# Patient Record
Sex: Female | Born: 2005 | Race: Black or African American | Hispanic: No | Marital: Single | Smoking: Never smoker
Health system: Southern US, Community
[De-identification: ages and names within clinical notes are randomized; demographics above are authoritative.]

## PROBLEM LIST (undated history)

## (undated) DIAGNOSIS — R51 Headache: Secondary | ICD-10-CM

## (undated) DIAGNOSIS — R519 Headache, unspecified: Secondary | ICD-10-CM

## (undated) HISTORY — PX: WISDOM TOOTH EXTRACTION: SHX21

## (undated) HISTORY — DX: Headache, unspecified: R51.9

## (undated) HISTORY — PX: TONSILLECTOMY AND ADENOIDECTOMY: SUR1326

## (undated) HISTORY — DX: Headache: R51

## (undated) HISTORY — PX: HERNIA REPAIR: SHX51

---
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
## 2023-01-29 IMAGING — US US BREAST*R* LIMITED INC AXILLA
2 series · 9 of 9 positions shown · non-contrast
Comparison: Previous exam(s).

CLINICAL DATA: 14-year-old female presenting for six-month
follow-up of a probably benign right breast mass.

EXAM:
ULTRASOUND OF THE RIGHT BREAST

[Series 1: us breast*right* limited inc axilla · 0.04mm/px · 6 of 6 slices shown (1 of 2)]
[im 1/6]
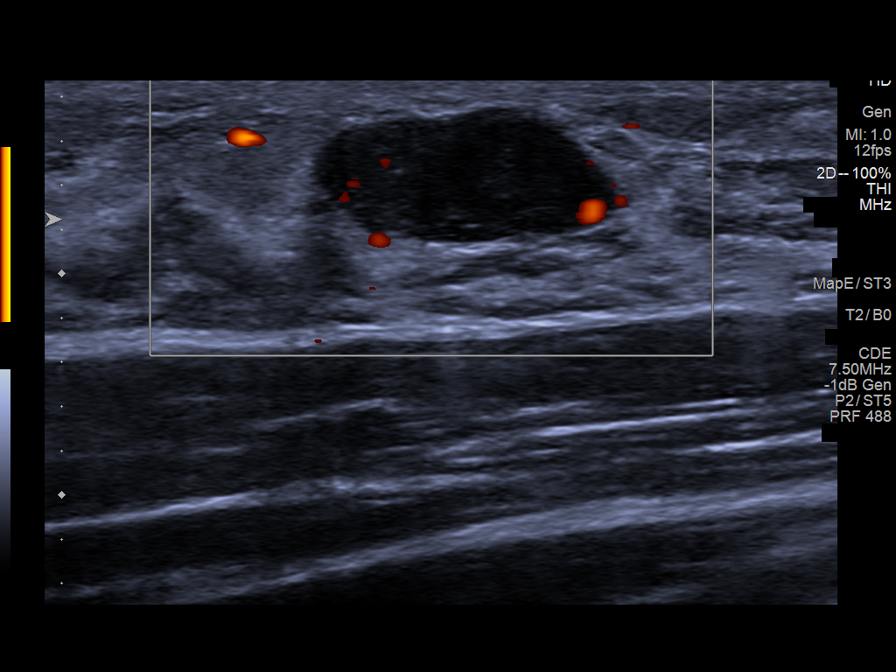
[im 2/6]
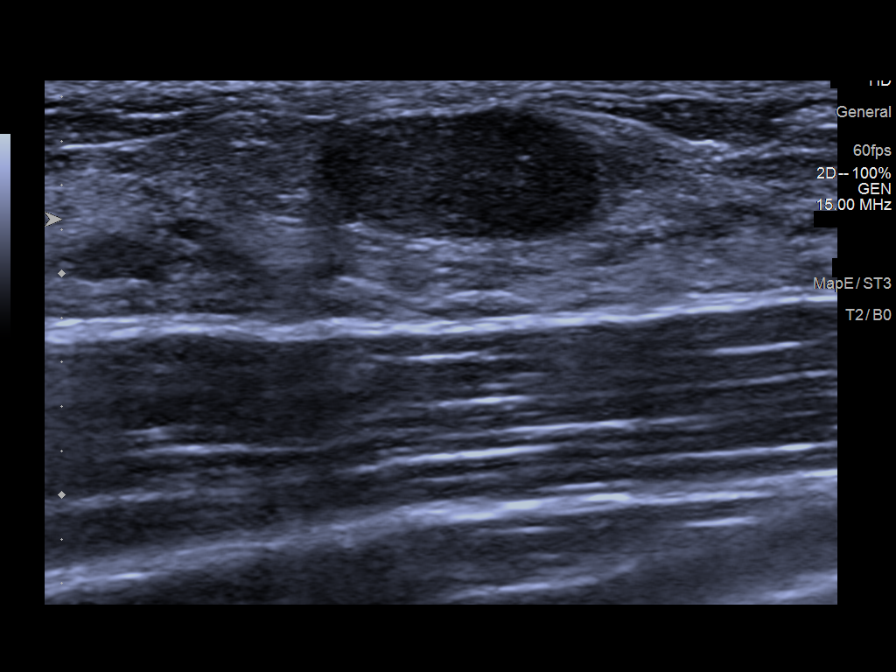
[im 3/6]
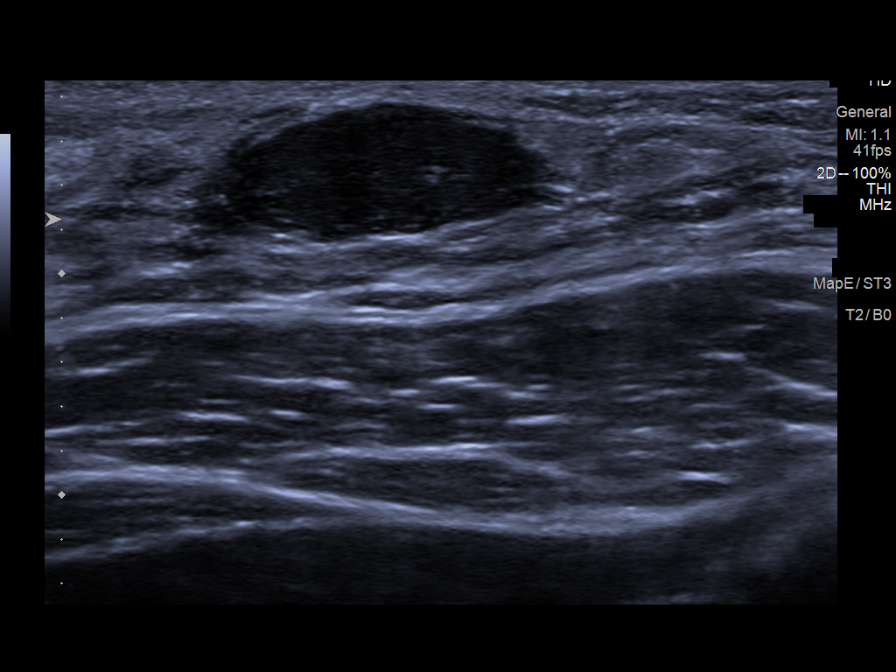
[im 4/6]
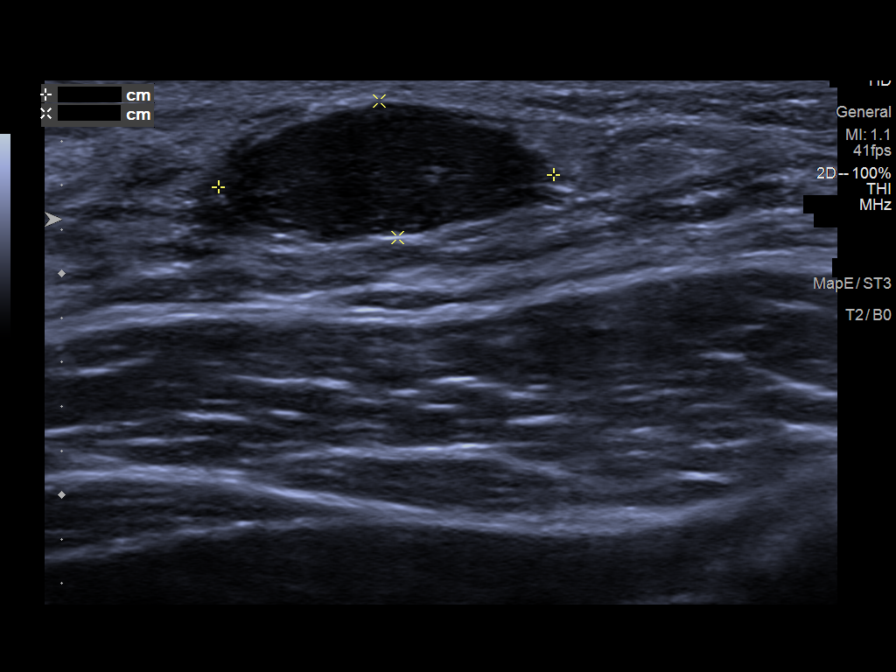
[im 5/6]
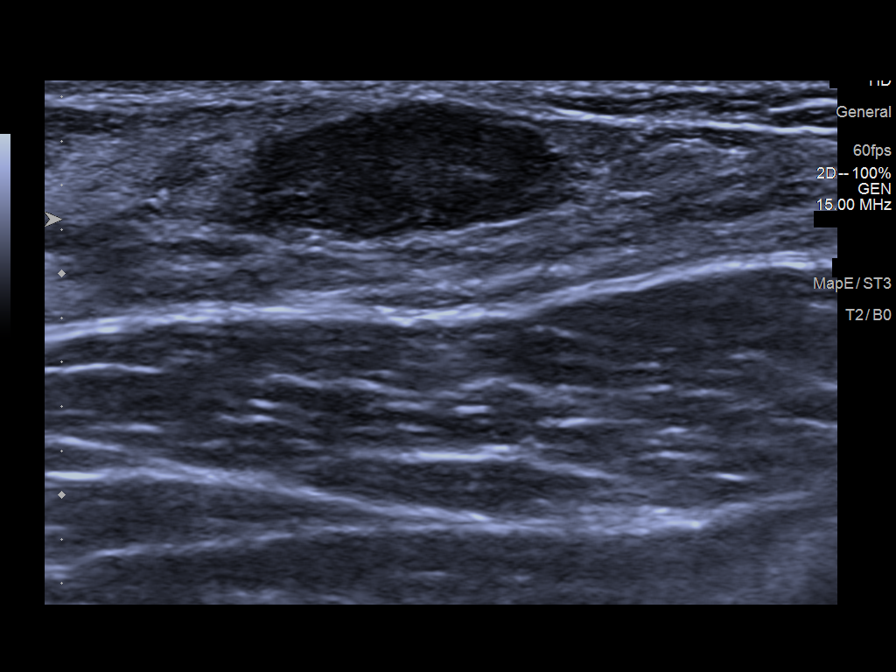
[im 6/6]
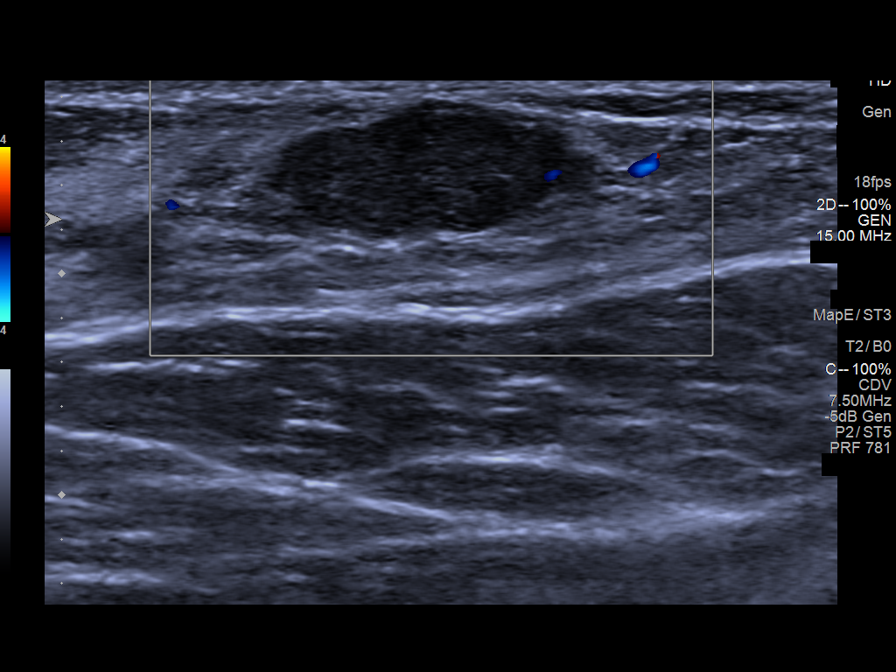

[Series 2: us breast*right* limited inc axilla · 0.05mm/px · 3 of 3 slices shown (2 of 2)]
[im 1/3]
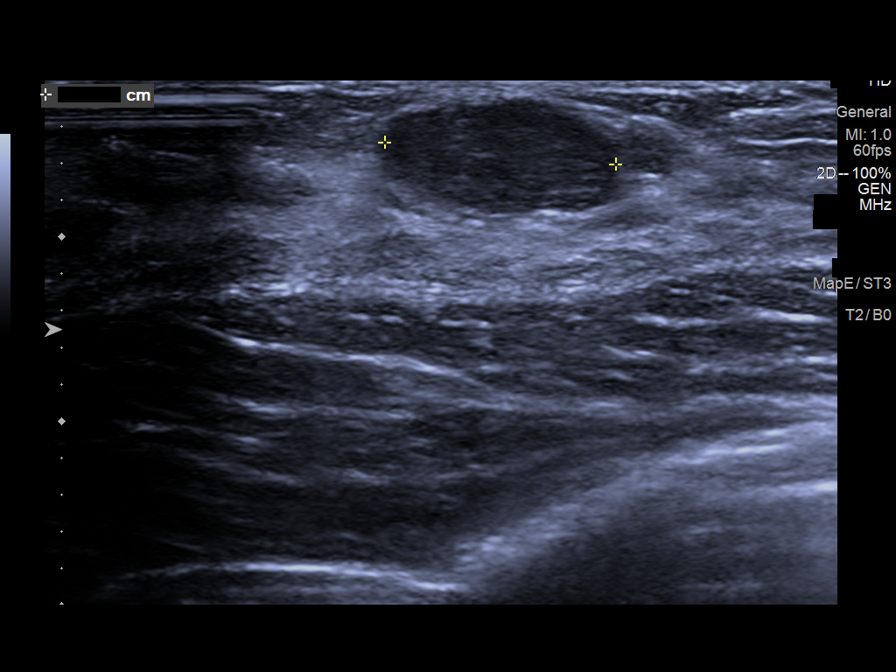
[im 2/3]
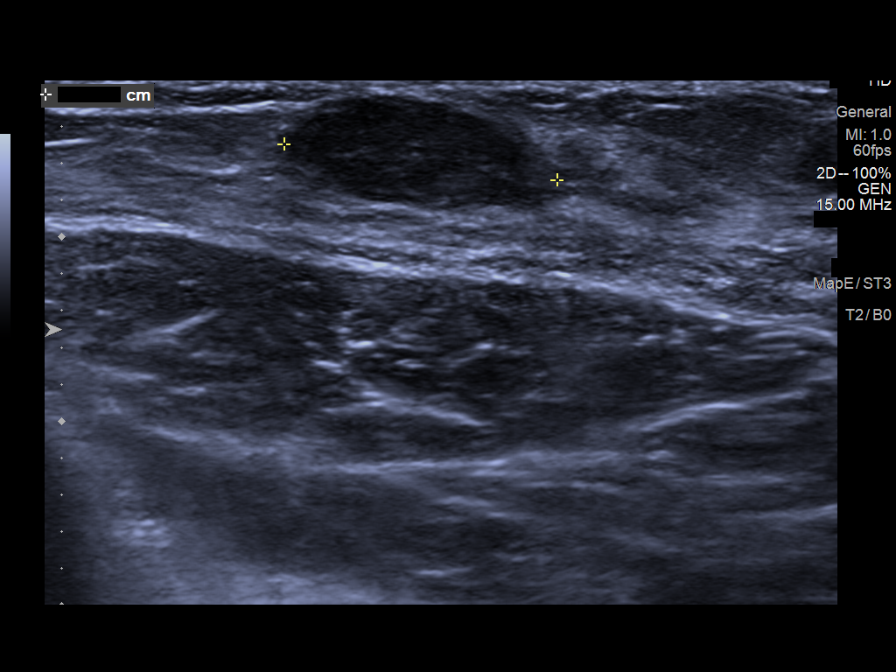
[im 3/3]
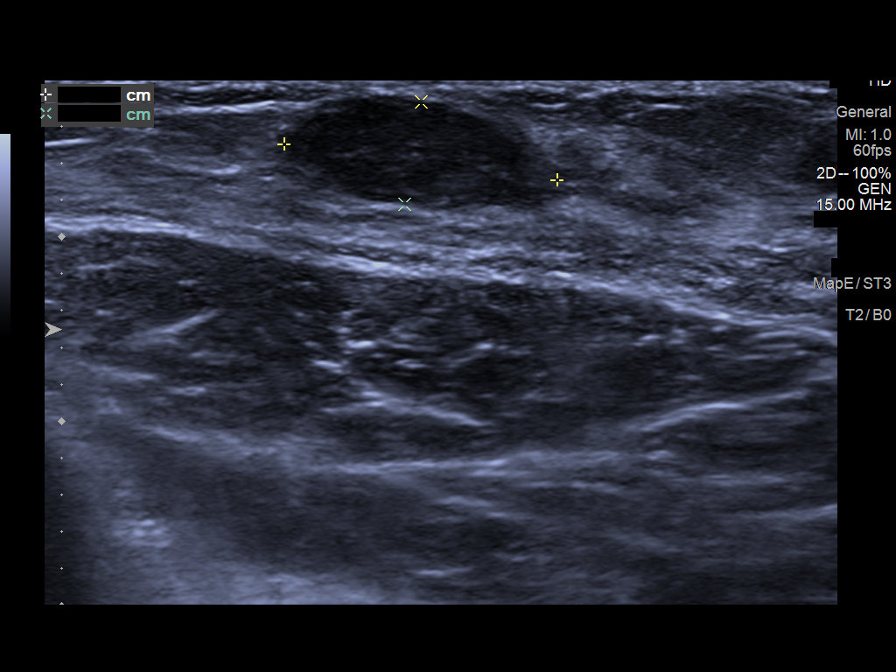

[9 of 9 positions shown; findings below may reference images not displayed]

FINDINGS: Targeted ultrasound is performed in the right breast at 1 o'clock 3
cm from the nipple demonstrating an oval circumscribed hypoechoic
mass measuring 1.5 x 0.6 x 1.3 cm, previously measuring 1.3 x 0.6 x
1.1 cm.
IMPRESSION: Stable probably benign mass in the right breast at 1 o'clock, likely
a fibroadenoma.

RECOMMENDATION:
Right breast ultrasound in 6 months.

I have discussed the findings and recommendations with the patient
and her mother. If applicable, a reminder letter will be sent to the
patient regarding the next appointment.

BI-RADS CATEGORY  3: Probably benign.

## 2023-02-09 ENCOUNTER — Ambulatory Visit (HOSPITAL_COMMUNITY)
Admission: RE | Admit: 2023-02-09 | Discharge: 2023-02-09 | Disposition: A | Discharge status: Home / Self Care | Payer: Medicaid Other | Source: Ambulatory Visit | Attending: Pediatrics | Admitting: Pediatrics

## 2023-02-09 ENCOUNTER — Other Ambulatory Visit: Payer: Self-pay | Admitting: Pediatrics

## 2023-02-09 ENCOUNTER — Encounter: Payer: Self-pay | Admitting: Pediatrics

## 2023-02-09 DIAGNOSIS — R109 Unspecified abdominal pain: Secondary | ICD-10-CM | POA: Diagnosis present

## 2023-03-07 ENCOUNTER — Telehealth (INDEPENDENT_AMBULATORY_CARE_PROVIDER_SITE_OTHER): Payer: Self-pay | Admitting: Family

## 2023-03-07 NOTE — Telephone Encounter (Signed)
  Name of who is calling: [facility]   Caller's Relationship to Patient: pcp  Best contact number: [phone]  Provider they see: Inetta Fermo   Reason for call: PCP is calling wondering if Inetta Fermo will still see pt due to her being 17 now and her last appt being last year in 2023. She states if not, could Inetta Fermo refer her to adult neurology. She says pt has been having some trouble with movements and swelling for several weeks. She would like a call back regarding this.       PRESCRIPTION REFILL ONLY  Name of prescription:  Pharmacy:

## 2023-03-07 NOTE — Telephone Encounter (Signed)
Please advise 

## 2023-03-08 NOTE — Telephone Encounter (Signed)
I left a message for the caller saying that since this is a new problem that she needs a referral to adult neurology. TG

## 2023-03-26 ENCOUNTER — Encounter: Payer: Self-pay | Admitting: Neurology

## 2023-03-26 ENCOUNTER — Ambulatory Visit (INDEPENDENT_AMBULATORY_CARE_PROVIDER_SITE_OTHER): Payer: Medicaid Other | Admitting: Neurology

## 2023-03-26 VITALS — BP 122/75 | HR 66 | Ht 62.0 in | Wt 132.5 lb

## 2023-03-26 DIAGNOSIS — G43709 Chronic migraine without aura, not intractable, without status migrainosus: Secondary | ICD-10-CM

## 2023-03-26 DIAGNOSIS — R1900 Intra-abdominal and pelvic swelling, mass and lump, unspecified site: Secondary | ICD-10-CM | POA: Diagnosis not present

## 2023-03-26 MED ORDER — RIZATRIPTAN BENZOATE 5 MG PO TBDP: 5.0000 mg | ORAL_TABLET | ORAL | 6 refills | Status: AC | PRN

## 2023-03-26 MED ORDER — ONDANSETRON HCL 4 MG PO TABS: 4.0000 mg | ORAL_TABLET | Freq: Three times a day (TID) | ORAL | 6 refills | Status: AC | PRN

## 2023-03-26 NOTE — Progress Notes (Signed)
Chief Complaint  Patient presents with   New Patient (Initial Visit)    Rm45mother present ,  pain to palpation/movement and visible right flank swelling(ongoing since mid July), ? focal myositis.       ASSESSMENT AND PLAN  Natalie Mclaughlin is a 17 y.o. female   Right flank area soft tissue mass  CT abdomen pelvic with contrast Chronic migraine headache  She was taking Topamax but only as needed, advised her to take Topamax every day as migraine prevention, Maxalt Zofran as needed   DIAGNOSTIC DATA (LABS, IMAGING, TESTING) - I reviewed patient records, labs, notes, testing and imaging myself where available.   MEDICAL HISTORY:  Natalie Mclaughlin, is a 17 year old female seen in request by her primary care physician Dr. Bronson Ing, for evaluation of right flank pain, initial evaluation was March 26, 2023  I reviewed and summarized the referring note.PMHx. Asthma Chronic migraine  She is currently on freshman at college, majoring sports medicine, practice cheerleader 2 hours each day, in summer she become more sedentary, in July 2024, she noticed fullness at the right flank area, mild tenderness with deep palpitation, initially noted slow enlargement of the soft tissue underneath her skin, but recent few weeks, the size has not changed, it does not affecting her daily activity, including practice cheerleader, but if she lie on it for extended period of time, she would feel pressure pain, discomfort, has to switch sides  Ultrasound of the area on February 09, 2023 reported normal sonographic appearance of subcutaneous fat and underlying musculature, no discrete lesion is present  She also have a history of chronic migraine headaches, at least once a week, she contributed to her irregular eating pattern, typical migraine are retro-orbital area moderate to severe pain, light noise sensitivity, nauseous, lasting for few hours, she tried over-the-counter medications with limited  help  Laboratory evaluation in July 2024, CPK was 323, ferritin was 6, C-reactive protein less than 1, ESR of 13, normal CMP, creatinine of 1.02, anemia hemoglobin of 10,  PHYSICAL EXAM:   Vitals:   03/26/23 0952  BP: 122/75  Pulse: 66  Weight: 132 lb 8 oz (60.1 kg)  Height: 5\' 2"  (1.575 m)   Not recorded     Body mass index is 24.23 kg/m.  PHYSICAL EXAMNIATION:  Gen: NAD, conversant, well nourised, well groomed                     Cardiovascular: Regular rate rhythm, no peripheral edema, warm, nontender. Eyes: Conjunctivae clear without exudates or hemorrhage Neck: Supple, no carotid bruits. Pulmonary: Clear to auscultation bilaterally   NEUROLOGICAL EXAM:  MENTAL STATUS: Speech/cognition: Awake, alert, oriented to history taking and casual conversation CRANIAL NERVES: CN II: Visual fields are full to confrontation. Pupils are round equal and briskly reactive to light. CN III, IV, VI: extraocular movement are normal. No ptosis. CN V: Facial sensation is intact to light touch CN VII: Face is symmetric with normal eye closure  CN VIII: Hearing is normal to causal conversation. CN IX, X: Phonation is normal. CN XI: Head turning and shoulder shrug are intact  MOTOR: There is no pronator drift of out-stretched arms. Muscle bulk and tone are normal. Muscle strength is normal.  There is a right flank area soft tissue mass, no clear boundary, palm size, mild tenderness with deep palpitation  REFLEXES: Reflexes are 2+ and symmetric at the biceps, triceps, knees, and ankles. Plantar responses are flexor.  SENSORY: Intact to light touch, pinprick  and vibratory sensation are intact in fingers and toes.  COORDINATION: There is no trunk or limb dysmetria noted.  GAIT/STANCE: Posture is normal. Gait is steady with normal steps, base, arm swing, and turning. Heel and toe walking are normal. Tandem gait is normal.  Romberg is absent.  REVIEW OF SYSTEMS:  Full 14 system  review of systems performed and notable only for as above All other review of systems were negative.   ALLERGIES: Allergies  Allergen Reactions   Apple Anaphylaxis   Apple Juice Anaphylaxis   Cephalexin Swelling   Other Anaphylaxis    Seafood, Trees, Pet dander   Peanut Oil Anaphylaxis   Peanut-Containing Drug Products Anaphylaxis   Shellfish Allergy Swelling    HOME MEDICATIONS: Current Outpatient Medications  Medication Sig Dispense Refill   Aspirin-Acetaminophen-Caffeine (EXCEDRIN PO) Give 1 tablet at onset of migraine     cetirizine (ZYRTEC) 10 MG tablet Take 10 mg by mouth daily.  5   EPINEPHrine 0.3 mg/0.3 mL IJ SOAJ injection See admin instructions.  0   fluticasone (FLONASE) 50 MCG/ACT nasal spray Place 2 sprays into both nostrils daily.     hydrocortisone 2.5 % ointment APPLY TOPICALLY 2 (TWO) TIMES DAILY. APPLY TWICE DAILY TO AREAS OF ECZEMA ON FACE  11   montelukast (SINGULAIR) 10 MG tablet Take 10 mg by mouth daily.  5   norgestimate-ethinyl estradiol (ORTHO-CYCLEN) 0.25-35 MG-MCG tablet Take 1 tablet by mouth daily.     promethazine (PHENERGAN) 12.5 MG suppository Place 12.5 mg rectally every 6 (six) hours as needed for nausea or vomiting.     PROVENTIL HFA 108 (90 BASE) MCG/ACT inhaler Inhale 108 mcg into the lungs every 6 (six) hours as needed.  0   No current facility-administered medications for this visit.    PAST MEDICAL HISTORY: Past Medical History:  Diagnosis Date   Headache     PAST SURGICAL HISTORY: Past Surgical History:  Procedure Laterality Date   HERNIA REPAIR     17 Yeras old and 2 repairs at 17 years old   TONSILLECTOMY AND ADENOIDECTOMY     17 Years old    FAMILY HISTORY: Family History  Problem Relation Age of Onset   Migraines Mother    Stroke Mother    Migraines Father    Migraines Maternal Grandfather     SOCIAL HISTORY: Social History   Socioeconomic History   Marital status: Single    Spouse name: Not on file   Number of  children: 0   Years of education: Not on file   Highest education level: Some college, no degree  Occupational History   Not on file  Tobacco Use   Smoking status: Never   Smokeless tobacco: Never  Vaping Use   Vaping status: Never Used  Substance and Sexual Activity   Alcohol use: No    Alcohol/week: 0.0 standard drinks of alcohol   Drug use: No   Sexual activity: Yes    Birth control/protection: Pill, Condom  Other Topics Concern   Not on file  Social History Narrative   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   Social Determinants of Health   Financial Resource Strain: Not on file  Food Insecurity: Not on file  Transportation Needs: Not on file  Physical Activity: Not on file  Stress: Not on file  Social Connections: Not on file  Intimate Partner Violence: Not on file      Levert Feinstein,  M.D. Ph.D.  [facility] [address] [place], [place] [address] Ph: [phone] Fax: [phone]  CC:  Bronson Ing, MD [address] [place],  [place] [address]  Chrys Racer, MD

## 2023-04-09 ENCOUNTER — Encounter: Payer: Self-pay | Admitting: Neurology

## 2023-04-17 ENCOUNTER — Ambulatory Visit
Admission: RE | Admit: 2023-04-17 | Discharge: 2023-04-17 | Disposition: A | Discharge status: Home / Self Care | Payer: Medicaid Other | Source: Ambulatory Visit | Attending: Neurology | Admitting: Neurology

## 2023-04-17 ENCOUNTER — Other Ambulatory Visit: Payer: Self-pay | Admitting: Neurology

## 2023-04-17 DIAGNOSIS — R1900 Intra-abdominal and pelvic swelling, mass and lump, unspecified site: Secondary | ICD-10-CM

## 2023-04-17 DIAGNOSIS — G43709 Chronic migraine without aura, not intractable, without status migrainosus: Secondary | ICD-10-CM

## 2023-04-17 MED ORDER — IOPAMIDOL (ISOVUE-300) INJECTION 61%
500.0000 mL | Freq: Once | INTRAVENOUS | Status: AC | PRN
  Administered 2023-04-17: 80 mL via INTRAVENOUS

## 2023-04-23 ENCOUNTER — Telehealth: Payer: Self-pay | Admitting: Neurology

## 2023-04-23 ENCOUNTER — Telehealth: Payer: Self-pay

## 2023-04-23 DIAGNOSIS — G4489 Other headache syndrome: Secondary | ICD-10-CM

## 2023-04-23 NOTE — Telephone Encounter (Signed)
Pt called and LVM stating that her migraines are reoccurring and worsening. Please call back to discuss.

## 2023-04-23 NOTE — Telephone Encounter (Signed)
Mychart message sent.

## 2023-04-23 NOTE — Telephone Encounter (Signed)
Call back to patient, she reports that she has not been taking the Topamax as daily prevention as advised by Dr. Terrace Arabia and is in agreement to start and use Maxalt and Zofran as needed. Encouraged patient to call with any changes or concerns. Patient verbalized understanding.

## 2023-04-23 NOTE — Telephone Encounter (Signed)
Pt reports that she went to her PCP today who suggested she reach out to her Neurologist when she explained that for the last 3 days she has had a headache, she has felt nauseated , tingling in arm, when driving yesterday she felt numbness on the side of her body.  Pt is asking for a call to discuss this with Dr Terrace Arabia

## 2023-04-23 NOTE — Telephone Encounter (Signed)
Returned call to patient, she states that yesterday she had a migraine with tingling and numbness on right side of body and that has happened before with her migraines. She reports nausea as well yesterday. She reports taking a maxalt yesterday and some Excedrin. I reviewed dosing and administration of maxalt and patient was appreciative because she wasn't aware that could have repeat dose of maxalt but no more than 2 in 1 day.. Patient states she just has headache today and it's much better but after seeing PCP she was encouraged to let us know. She states PCP mentioned scan or MRI and she wanted to follow up. Reviewed stroke symptoms with patient due to mother history of stroke. She denies any slurred speech, visions changes, balance issues and verbalized understanding to go ER is symptoms differ from her typical migraine symptoms. Advised I would send to Dr. Terrace Arabia for review/recommendations and follow back up. Patient appreciative of call

## 2023-04-24 ENCOUNTER — Telehealth: Payer: Self-pay

## 2023-04-24 NOTE — Telephone Encounter (Signed)
Patient scheduled for new patient visit with [facility].  Was previously evaluated at [facility] in 03/2023 and she would like to keep the upcoming appt with Dr. Azucena Cecil.  Denies any other cardiac history.

## 2023-04-25 ENCOUNTER — Encounter: Payer: Self-pay | Admitting: Neurology

## 2023-04-25 NOTE — Telephone Encounter (Signed)
Pt's mother is asking if a MRI can be ordered for pt, she is ok with pt being sent a response to this request.

## 2023-04-25 NOTE — Telephone Encounter (Signed)
Orders Placed This Encounter  Procedures   MR BRAIN WO CONTRAST      

## 2023-04-25 NOTE — Addendum Note (Signed)
Addended by: Levert Feinstein on: 04/25/2023 09:52 AM   Modules accepted: Orders

## 2023-04-26 ENCOUNTER — Ambulatory Visit: Payer: Medicaid Other | Attending: Cardiology | Admitting: Cardiology

## 2023-04-26 ENCOUNTER — Encounter: Payer: Self-pay | Admitting: Cardiology

## 2023-04-26 VITALS — BP 114/80 | HR 70 | Ht 62.0 in | Wt 131.4 lb

## 2023-04-26 DIAGNOSIS — R748 Abnormal levels of other serum enzymes: Secondary | ICD-10-CM

## 2023-04-26 NOTE — Patient Instructions (Signed)
Medication Instructions:   Your physician recommends that you continue on your current medications as directed. Please refer to the Current Medication list given to you today.  *If you need a refill on your cardiac medications before your next appointment, please call your pharmacy*   Lab Work:  None Ordered  If you have labs (blood work) drawn today and your tests are completely normal, you will receive your results only by: MyChart Message (if you have MyChart) OR A paper copy in the mail If you have any lab test that is abnormal or we need to change your treatment, we will call you to review the results.   Testing/Procedures:  None Ordered   Follow-Up: At [facility], you and your health needs are our priority.  As part of our continuing mission to provide you with exceptional heart care, we have created designated Provider Care Teams.  These Care Teams include your primary Cardiologist (physician) and Advanced Practice Providers (APPs -  Physician Assistants and Nurse Practitioners) who all work together to provide you with the care you need, when you need it.  We recommend signing up for the patient portal called "MyChart".  Sign up information is provided on this After Visit Summary.  MyChart is used to connect with patients for Virtual Visits (Telemedicine).  Patients are able to view lab/test results, encounter notes, upcoming appointments, etc.  Non-urgent messages can be sent to your provider as well.   To learn more about what you can do with MyChart, go to [email or web address].    Your next appointment:  AS NEEDED 

## 2023-04-26 NOTE — Progress Notes (Signed)
Cardiology Office Note:    Date:  04/26/2023   ID:  Natalie Mclaughlin, DOB 02-02-2006, MRN [id number]  PCP:  Chrys Racer, MD   [facility] Providers Cardiologist:  None     Referring MD: Chrys Racer, MD   Chief Complaint  Patient presents with   Follow-up    Referral for elevated CK level.  Previous cardiac eval by [facility] cardiology in 03/2023 and patient would like a second opinion.  04/03/23 echo result available in Care Everywhere.      History of Present Illness:    Natalie Mclaughlin is a 17 y.o. female with a hx of migraines, asthma who presents due to elevated CK level.  Patient recently diagnosed with a lump on the right side of her torso.  Underwent blood work with CK in July showing elevated CK levels.  Initially in the 200 range, repeat CK in August 2024 levels were elevated up to 800.  She is competitive Biochemist, clinical, does cheerleading activities almost daily.  Last CK check 2 weeks ago showed levels at 267.  Was evaluated at [facility] cardiology, echocardiogram obtained 9/24 showed normal EF, mild MR, normal RV function.  She denies chest pain, shortness of breath, muscle aches.  Has occasional numbness, seeing a neurologist, brain MRI being planned.  Past Medical History:  Diagnosis Date   Headache     Past Surgical History:  Procedure Laterality Date   HERNIA REPAIR     51 Yeras old and 2 repairs at 17 years old   TONSILLECTOMY AND ADENOIDECTOMY     17 Years old    Current Medications: Current Meds  Medication Sig   Aspirin-Acetaminophen-Caffeine (EXCEDRIN PO) Give 1 tablet at onset of migraine   cetirizine (ZYRTEC) 10 MG tablet Take 10 mg by mouth daily.   EPINEPHrine 0.3 mg/0.3 mL IJ SOAJ injection See admin instructions.   fluticasone (FLONASE) 50 MCG/ACT nasal spray Place 2 sprays into both nostrils daily.   hydrocortisone 2.5 % ointment APPLY TOPICALLY 2 (TWO) TIMES DAILY. APPLY TWICE DAILY TO AREAS OF ECZEMA ON FACE   montelukast  (SINGULAIR) 10 MG tablet Take 10 mg by mouth daily.   norgestimate-ethinyl estradiol (ORTHO-CYCLEN) 0.25-35 MG-MCG tablet Take 1 tablet by mouth daily.   ondansetron (ZOFRAN) 4 MG tablet Take 1 tablet (4 mg total) by mouth every 8 (eight) hours as needed for nausea or vomiting.   PROVENTIL HFA 108 (90 BASE) MCG/ACT inhaler Inhale 108 mcg into the lungs every 6 (six) hours as needed.   rizatriptan (MAXALT-MLT) 5 MG disintegrating tablet Take 1 tablet (5 mg total) by mouth as needed for migraine. May repeat in 2 hours if needed     Allergies:   Apple, Apple juice, Cephalexin, Other, Peanut oil, Peanut-containing drug products, and Shellfish allergy   Social History   Socioeconomic History   Marital status: Single    Spouse name: Not on file   Number of children: 0   Years of education: Not on file   Highest education level: Some college, no degree  Occupational History   Not on file  Tobacco Use   Smoking status: Never   Smokeless tobacco: Never  Vaping Use   Vaping status: Never Used  Substance and Sexual Activity   Alcohol use: No    Alcohol/week: 0.0 standard drinks of alcohol   Drug use: No   Sexual activity: Yes    Birth control/protection: Pill, Condom  Other Topics Concern   Not on file  Social History  Narrative   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   Social Determinants of Health   Financial Resource Strain: Not on file  Food Insecurity: Not on file  Transportation Needs: Not on file  Physical Activity: Not on file  Stress: Not on file  Social Connections: Not on file     Family History: The patient's family history includes Migraines in her father, maternal grandfather, and mother; Stroke in her mother.  ROS:   Please see the history of present illness.     All other systems reviewed and are negative.  EKGs/Labs/Other Studies Reviewed:    The following studies were reviewed  today:  EKG Interpretation Date/Time:  Thursday April 26 2023 10:55:59 EDT Ventricular Rate:  70 PR Interval:  138 QRS Duration:  88 QT Interval:  378 QTC Calculation: 408 R Axis:   82  Text Interpretation: Normal sinus rhythm Confirmed by Debbe Odea ([id number]) on 04/26/2023 11:08:41 AM    Recent Labs: No results found for requested labs within last 365 days.  Recent Lipid Panel No results found for: "CHOL", "TRIG", "HDL", "CHOLHDL", "VLDL", "LDLCALC", "LDLDIRECT"   Risk Assessment/Calculations:             Physical Exam:    VS:  BP 114/80 (BP Location: Left Arm, Patient Position: Sitting, Cuff Size: Normal)   Pulse 70   Ht 5\' 2"  (1.575 m)   Wt 131 lb 6.4 oz (59.6 kg)   SpO2 99%   BMI 24.03 kg/m     Wt Readings from Last 3 Encounters:  04/26/23 131 lb 6.4 oz (59.6 kg) (58%, Z= 0.20)*  03/26/23 132 lb 8 oz (60.1 kg) (60%, Z= 0.26)*  02/23/22 139 lb 2 oz (63.1 kg) (74%, Z= 0.64)*   * Growth percentiles are based on CDC (Girls, 2-20 Years) data.     GEN:  Well nourished, well developed in no acute distress HEENT: Normal NECK: No JVD; No carotid bruits CARDIAC: RRR, no murmurs, rubs, gallops RESPIRATORY:  Clear to auscultation without rales, wheezing or rhonchi  ABDOMEN: Soft, non-tender, non-distended MUSCULOSKELETAL:  No edema; No deformity  SKIN: Warm and dry NEUROLOGIC:  Alert and oriented x 3 PSYCHIATRIC:  Normal affect   ASSESSMENT:    1. Elevated CK    PLAN:    In order of problems listed above:  Elevated CK levels, denies chest pain or muscle aches.  Abnormal CK levels likely from muscle breakdown from competitive cheerleading.  She is otherwise asymptomatic.  Echo with normal function.  Recommend adequate hydration when performing strenuous activities.  No additional testing indicated from a cardiac perspective.  She is low risk for CAD.  Follow-up as needed      Medication Adjustments/Labs and Tests Ordered: Current medicines are  reviewed at length with the patient today.  Concerns regarding medicines are outlined above.  Orders Placed This Encounter  Procedures   EKG 12-Lead   No orders of the defined types were placed in this encounter.   There are no Patient Instructions on file for this visit.   Signed, Debbe Odea, MD  04/26/2023 12:20 PM    [facility]

## 2023-05-08 ENCOUNTER — Telehealth: Payer: Self-pay | Admitting: Neurology

## 2023-05-08 NOTE — Telephone Encounter (Signed)
Pt's mother is asking for a call with results to MRI

## 2023-05-08 NOTE — Telephone Encounter (Signed)
Called mother and they stated that they had imaging done at [facility] in 04/17/23 and haven't heard back on results.   Mother is requesting a ct and mri of brain to be based on previous call regarding numbness in arm.

## 2023-05-08 NOTE — Telephone Encounter (Signed)
[employer] Natalie Mclaughlin: [id number] exp. 05/02/23-07/01/23 sent to GI [phone]

## 2023-05-09 NOTE — Telephone Encounter (Signed)
Dr. Terrace Arabia,  They stated that they completed some imagaing at [facility] earlier in the month of October but they haven't gotten results. Do you know how to go about retrieving those? Thanks,  Production assistant, radio

## 2023-05-09 NOTE — Telephone Encounter (Signed)
CT abdomen showed right flank lipoma,   The result was sent via MYCHART  IMPRESSION: 1. Right flank fat density lesion consistent with a lipoma. 2. Otherwise no acute abdominal or pelvic pathology identified. 3. For technical reasons, this examination is limited in its ability to evaluate for pelvic pathology, and pelvic sonography might be helpful.

## 2023-05-09 NOTE — Telephone Encounter (Signed)
Called and spoke to pts mother and stated: CT abdomen showed right flank lipoma   Pt mother would like to know what to do inregards to lipoma does it need to be removed? If removed will it return? What caused it?

## 2023-05-09 NOTE — Telephone Encounter (Signed)
Called and left msg to call back 1st attempt

## 2023-05-10 NOTE — Telephone Encounter (Signed)
I have forward CT report to her PCP. Chrys Racer, MD   I think it is a benign findings, no need to have surgery. She may ask her PCP for follow up and treatment guidance

## 2023-05-21 ENCOUNTER — Telehealth: Payer: Self-pay | Admitting: Neurology

## 2023-05-21 NOTE — Telephone Encounter (Signed)
Pt mother called wanting to know if the RN was ever able to speak to the MD regarding the detailed questions they have regarding the pt's Lipoma. Please advise.

## 2023-05-21 NOTE — Telephone Encounter (Signed)
Called and spoke to pts mother and stated:  I have forward CT report to her PCP. Chrys Racer, MD    I think it is a benign findings, no need to have surgery. She may ask her PCP for follow up and treatment guidance     Pt mother voiced understanding of all discussed

## 2023-05-28 ENCOUNTER — Other Ambulatory Visit: Payer: Medicaid Other

## 2023-06-09 ENCOUNTER — Other Ambulatory Visit: Payer: Medicaid Other

## 2023-06-11 ENCOUNTER — Ambulatory Visit: Payer: Medicaid Other | Admitting: Diagnostic Neuroimaging

## 2023-07-25 ENCOUNTER — Other Ambulatory Visit: Payer: Self-pay

## 2023-07-25 DIAGNOSIS — Z5321 Procedure and treatment not carried out due to patient leaving prior to being seen by health care provider: Secondary | ICD-10-CM | POA: Insufficient documentation

## 2023-07-25 DIAGNOSIS — R200 Anesthesia of skin: Secondary | ICD-10-CM | POA: Insufficient documentation

## 2023-07-25 DIAGNOSIS — G43909 Migraine, unspecified, not intractable, without status migrainosus: Secondary | ICD-10-CM | POA: Diagnosis present

## 2023-07-25 NOTE — ED Provider Triage Note (Signed)
 Emergency Medicine Provider Triage Evaluation Note  Natalie Mclaughlin , a 18 y.o. female  was evaluated in triage.  Pt complains of  Migraine associated with numbness on her face and arms.  Has diagnosis of migraines on has prescription for 10.  Patient denies taking the medicine Review of Systems  Positive:  Negative:   Physical Exam  BP 120/78   Pulse 99   Temp 98 F (36.7 C) (Oral)   Resp 16   LMP 07/02/2023   SpO2 98%  Gen:   Awake, no distress   Resp:  Normal effort  MSK:   Moves extremities without difficulty  Other:    Medical Decision Making  Medically screening exam initiated at 10:17 PM.  Appropriate orders placed.  Sargun Sisler was informed that the remainder of the evaluation will be completed by another provider, this initial triage assessment does not replace that evaluation, and the importance of remaining in the ED until their evaluation is complete.     Janit Kast, PA-C 07/25/23 2219

## 2023-07-25 NOTE — ED Triage Notes (Addendum)
 Pt to ed from home via POV for migraines that cause numbness in both arms all the way up to her face. Pt is caox4, in no acute distress and ambulatory. Pt states they are spiratic and the numbness doesn't happen with each one. Pt is prescribed medication for her migraines. Pt last visit with neurology for migraines has been awhile. Pt refuses to take her prescibed meds for same. Mother advised same, she tries to get daughter to take her meds but she refuses. Pt states I just dont like taking meds.

## 2023-07-26 ENCOUNTER — Emergency Department
Admission: EM | Admit: 2023-07-26 | Discharge: 2023-07-26 | Discharge status: Against Medical Advice | Payer: Medicaid Other | Attending: Emergency Medicine | Admitting: Emergency Medicine

## 2023-07-29 ENCOUNTER — Other Ambulatory Visit: Payer: Self-pay

## 2023-07-29 ENCOUNTER — Emergency Department (HOSPITAL_COMMUNITY): Payer: Medicaid Other

## 2023-07-29 ENCOUNTER — Emergency Department (HOSPITAL_COMMUNITY)
Admission: EM | Admit: 2023-07-29 | Discharge: 2023-07-29 | Disposition: A | Discharge status: Home / Self Care | Payer: Medicaid Other

## 2023-07-29 DIAGNOSIS — Z91010 Allergy to peanuts: Secondary | ICD-10-CM | POA: Diagnosis not present

## 2023-07-29 DIAGNOSIS — G43809 Other migraine, not intractable, without status migrainosus: Secondary | ICD-10-CM | POA: Insufficient documentation

## 2023-07-29 DIAGNOSIS — G43909 Migraine, unspecified, not intractable, without status migrainosus: Secondary | ICD-10-CM | POA: Diagnosis present

## 2023-07-29 LAB — COMPREHENSIVE METABOLIC PANEL
ALT: 14 U/L (ref 0–44)
AST: 19 U/L (ref 15–41)
Albumin: 3.5 g/dL (ref 3.5–5.0)
Alkaline Phosphatase: 34 U/L — ABNORMAL LOW (ref 38–126)
Anion gap: 8 (ref 5–15)
BUN: 12 mg/dL (ref 6–20)
CO2: 22 mmol/L (ref 22–32)
Calcium: 9.2 mg/dL (ref 8.9–10.3)
Chloride: 107 mmol/L (ref 98–111)
Creatinine, Ser: 0.95 mg/dL (ref 0.44–1.00)
GFR, Estimated: >60 mL/min (ref >60)
Glucose, Bld: 93 mg/dL (ref 70–99)
Potassium: 3.5 mmol/L (ref 3.5–5.1)
Sodium: 137 mmol/L (ref 135–145)
Total Bilirubin: 0.5 mg/dL (ref 0.0–1.2)
Total Protein: 7.9 g/dL (ref 6.5–8.1)

## 2023-07-29 LAB — CBC WITH DIFFERENTIAL/PLATELET
Abs Immature Granulocytes: 0.01 10*3/uL (ref 0.00–0.07)
Basophils Absolute: 0.1 10*3/uL (ref 0.0–0.1)
Basophils Relative: 2 %
Eosinophils Absolute: 0.6 10*3/uL — ABNORMAL HIGH (ref 0.0–0.5)
Eosinophils Relative: 9 %
HCT: 35.7 % — ABNORMAL LOW (ref 36.0–46.0)
Hemoglobin: 10.3 g/dL — ABNORMAL LOW (ref 12.0–15.0)
Immature Granulocytes: 0 %
Lymphocytes Relative: 42 %
Lymphs Abs: 3.2 10*3/uL (ref 0.7–4.0)
MCH: 21.3 pg — ABNORMAL LOW (ref 26.0–34.0)
MCHC: 28.9 g/dL — ABNORMAL LOW (ref 30.0–36.0)
MCV: 73.9 fL — ABNORMAL LOW (ref 80.0–100.0)
Monocytes Absolute: 0.6 10*3/uL (ref 0.1–1.0)
Monocytes Relative: 8 %
Neutro Abs: 2.9 10*3/uL (ref 1.7–7.7)
Neutrophils Relative %: 39 %
Platelets: 417 10*3/uL — ABNORMAL HIGH (ref 150–400)
RBC: 4.83 MIL/uL (ref 3.87–5.11)
RDW: 16.4 % — ABNORMAL HIGH (ref 11.5–15.5)
WBC: 7.5 10*3/uL (ref 4.0–10.5)
nRBC: 0 % (ref 0.0–0.2)

## 2023-07-29 MED ORDER — PROCHLORPERAZINE MALEATE 10 MG PO TABS
10.0000 mg | ORAL_TABLET | Freq: Once | ORAL | Status: AC
  Administered 2023-07-29: 10 mg via ORAL
  Filled 2023-07-29: qty 1

## 2023-07-29 MED ORDER — KETOROLAC TROMETHAMINE 30 MG/ML IJ SOLN
30.0000 mg | Freq: Once | INTRAMUSCULAR | Status: AC
  Administered 2023-07-29: 30 mg via INTRAMUSCULAR
  Filled 2023-07-29: qty 1

## 2023-07-29 NOTE — Discharge Instructions (Addendum)
 You were seen in the ER today with concerns of a migraine. Your labs and CT imaging were reassuring. I suspect this is likely a complex migraine causing these symptoms. You opted for injection and oral medications as opposed to IV medications to treat the migraine which may result in longer time for migraine resolution or failure of migraine resolution. Please follow up with your neurologist for further evaluation. Return to the ER if symptoms are worsening.

## 2023-07-29 NOTE — ED Provider Notes (Signed)
 [place] EMERGENCY DEPARTMENT AT [facility] Provider Note   CSN: [id number] Arrival date & time: 07/29/23  1622     History Chief Complaint  Patient presents with   Migraine    Natalie Mclaughlin is a 18 y.o. female.  Patient with past history significant for chronic migraines, prior concussions, and prior head injury with concerns of a migraine.  States that she has been doing this current migraine for about 3 to 4 days without relief.  No at home medications or alleviating migraine.  Does also feel that she is having some numbness and tingling primarily to the left side but has had some intermittent bilateral numbness.  Patient's mother and father concern for possible complicating features to migraine as she typically does not have this type of numbness.  She currently follows with [facility] neurology.   Migraine Associated symptoms include headaches.       Home Medications Prior to Admission medications   Medication Sig Start Date End Date Taking? Authorizing Provider  Aspirin-Acetaminophen -Caffeine (EXCEDRIN PO) Give 1 tablet at onset of migraine    [provider]  cetirizine (ZYRTEC) 10 MG tablet Take 10 mg by mouth daily. 07/22/14   [provider]  EPINEPHrine  0.3 mg/0.3 mL IJ SOAJ injection See admin instructions. 07/28/15   [provider]  fluticasone (FLONASE) 50 MCG/ACT nasal spray Place 2 sprays into both nostrils daily.    [provider]  hydrocortisone 2.5 % ointment APPLY TOPICALLY 2 (TWO) TIMES DAILY. APPLY TWICE DAILY TO AREAS OF ECZEMA ON FACE 08/07/16   [provider]  montelukast (SINGULAIR) 10 MG tablet Take 10 mg by mouth daily. 05/15/18   [provider]  norgestimate-ethinyl estradiol (ORTHO-CYCLEN) 0.25-35 MG-MCG tablet Take 1 tablet by mouth daily. 02/15/22   [provider]  ondansetron  (ZOFRAN ) 4 MG tablet Take 1 tablet (4 mg total) by mouth every 8 (eight) hours as needed for nausea or  vomiting. 03/26/23   Onita Duos, MD  promethazine  (PHENERGAN ) 12.5 MG suppository Place 12.5 mg rectally every 6 (six) hours as needed for nausea or vomiting. Patient not taking: Reported on 04/26/2023    [provider]  PROVENTIL HFA 108 (90 BASE) MCG/ACT inhaler Inhale 108 mcg into the lungs every 6 (six) hours as needed. 07/22/14   [provider]  rizatriptan  (MAXALT -MLT) 5 MG disintegrating tablet Take 1 tablet (5 mg total) by mouth as needed for migraine. May repeat in 2 hours if needed 03/26/23   Onita Duos, MD      Allergies    Apple, Apple juice, Cephalexin, Other, Peanut oil, Peanut-containing drug products, and Shellfish allergy    Review of Systems   Review of Systems  Neurological:  Positive for headaches.  All other systems reviewed and are negative.   Physical Exam Updated Vital Signs BP 125/76 (BP Location: Right Arm)   Pulse 82   Temp 97.7 F (36.5 C) (Oral)   Resp 18   Ht 5' 2 (1.575 m)   Wt 56.7 kg   LMP 07/02/2023   SpO2 99%   BMI 22.86 kg/m  Physical Exam Vitals and nursing note reviewed.  Constitutional:      General: She is not in acute distress.    Appearance: She is well-developed.  HENT:     Head: Normocephalic and atraumatic.  Eyes:     Conjunctiva/sclera: Conjunctivae normal.  Cardiovascular:     Rate and Rhythm: Normal rate and regular rhythm.     Heart sounds: No  murmur heard. Pulmonary:     Effort: Pulmonary effort is normal. No respiratory distress.     Breath sounds: Normal breath sounds.  Abdominal:     Palpations: Abdomen is soft.     Tenderness: There is no abdominal tenderness.  Musculoskeletal:        General: No swelling.     Cervical back: Neck supple.  Skin:    General: Skin is warm and dry.     Capillary Refill: Capillary refill takes less than 2 seconds.  Neurological:     General: No focal deficit present.     Mental Status: She is alert and oriented to person, place, and time. Mental status is at  baseline.     Cranial Nerves: No cranial nerve deficit.     Sensory: No sensory deficit.     Motor: No weakness.     Comments: RUE and LUE 5/5 strength, RLE and LLE 5/5 strength. Sensation intact. No nystagmus or pronator drift.   Psychiatric:        Mood and Affect: Mood normal.     ED Results / Procedures / Treatments   Labs (all labs ordered are listed, but only abnormal results are displayed) Labs Reviewed  CBC WITH DIFFERENTIAL/PLATELET - Abnormal; Notable for the following components:      Result Value   Hemoglobin 10.3 (*)    HCT 35.7 (*)    MCV 73.9 (*)    MCH 21.3 (*)    MCHC 28.9 (*)    RDW 16.4 (*)    Platelets 417 (*)    Eosinophils Absolute 0.6 (*)    All other components within normal limits  COMPREHENSIVE METABOLIC PANEL - Abnormal; Notable for the following components:   Alkaline Phosphatase 34 (*)    All other components within normal limits    EKG None  Radiology CT Head Wo Contrast Result Date: 07/29/2023 CLINICAL DATA:  Headache. EXAM: CT HEAD WITHOUT CONTRAST TECHNIQUE: Contiguous axial images were obtained from the base of the skull through the vertex without intravenous contrast. RADIATION DOSE REDUCTION: This exam was performed according to the departmental dose-optimization program which includes automated exposure control, adjustment of the mA and/or kV according to patient size and/or use of iterative reconstruction technique. COMPARISON:  June 04, 2018 FINDINGS: Brain: No evidence of acute infarction, hemorrhage, hydrocephalus, extra-axial collection or mass lesion/mass effect. Vascular: No hyperdense vessel or unexpected calcification. Skull: Normal. Negative for fracture or focal lesion. Sinuses/Orbits: There is mild bilateral ethmoid sinus mucosal thickening. Other: None. IMPRESSION: No acute intracranial pathology. Electronically Signed   By: Suzen Dials M.D.   On: 07/29/2023 21:27    Procedures Procedures    Medications Ordered in  ED Medications  prochlorperazine  (COMPAZINE ) tablet 10 mg (has no administration in time range)  ketorolac  (TORADOL ) 30 MG/ML injection 30 mg (has no administration in time range)    ED Course/ Medical Decision Making/ A&P                                 Medical Decision Making Amount and/or Complexity of Data Reviewed Labs: ordered. Radiology: ordered.   This patient presents to the ED for concern of migraine.  Differential diagnosis includes hemiplegic migraine, complex migraine, cluster headache, SAH, brain mass   Lab Tests:  I Ordered, and personally interpreted labs.  The pertinent results include: CBC with slight decrease in hemoglobin at 10.3 with no other labs to compare to,  CMP unremarkable   Imaging Studies ordered:  I ordered imaging studies including CT head I independently visualized and interpreted imaging which showed regular critical care normality I agree with the radiologist interpretation   Medicines ordered and prescription drug management:  I ordered medication including Toradol , Compazine  for migraine Reevaluation of the patient after these medicines showed that the patient stayed the same I have reviewed the patients home medicines and have made adjustments as needed   Problem List / ED Course:  Patient past history significant for chronic migraines here with concerns of a headache.  States that she has had a migraine type headache for the last for 4 days.  Also slight sensitivity.  Also noticing nausea but denies any vomiting.  Has had some intermittent episodes of primarily left-sided numbness and tingling on her face and upper body.  Denies any weakness.  No prior history of stroke.  Not on blood thinners. On exam, no acute neurological deficits are seen.  Doubt stroke.  Patient's mother is requesting CT imaging and patient is agreeable with this.  Advised to seek imaging likely not show anything as this appears to be highly consistent with a more  complex type migraine.  However we will proceed with CT imaging per patient's request. CT imaging is negative. Discussed treatment with IV medications for migraine.  Patient is apprehensive to have an IV placed and stapled off for IM and p.o. medications as an alternative.  Advised that these likely would not work as well and may not resolve migraine headache.  Patient was comfortable to take this route versus IV placement.  Discussed return precautions such as failure for migraines improved, worsening headache, or any other acute concerns.  Given reassuring workup, believe the patient stable for discharge home as she like will not receive notable symptomatic improvement given the IM and p.o. route of medications for migraine.  Patient reported understanding all return precautions and agreeable current plan for discharge home.  Discharged home stable condition.  Final Clinical Impression(s) / ED Diagnoses Final diagnoses:  Other migraine without status migrainosus, not intractable    Rx / DC Orders ED Discharge Orders     None         Tank Difiore A, PA-C 07/29/23 2143    Ula Prentice SAUNDERS, MD 07/29/23 2202

## 2023-07-29 NOTE — ED Triage Notes (Signed)
 Migraine for 3-4 days, no relief with home migraine meds. Pt states she has numbness and tingling in face and both sides, but it is worse on the left. Light sensitivity.

## 2023-07-31 ENCOUNTER — Encounter: Payer: Self-pay | Admitting: Neurology

## 2023-08-09 ENCOUNTER — Inpatient Hospital Stay: Admission: RE | Admit: 2023-08-09 | Payer: Medicaid Other | Source: Ambulatory Visit

## 2023-09-10 IMAGING — US US BREAST*R* LIMITED INC AXILLA
1 series · 5 of 5 positions shown · non-contrast
Comparison: Previous exam(s).

CLINICAL DATA: BI-RADS 3 follow-up of RIGHT breast mass, initiated
February 2020

EXAM:
ULTRASOUND OF THE RIGHT BREAST

[Series 1: us breast*right* limited inc axilla · 0.05mm/px · 5 of 5 slices shown]
[im 1/5]
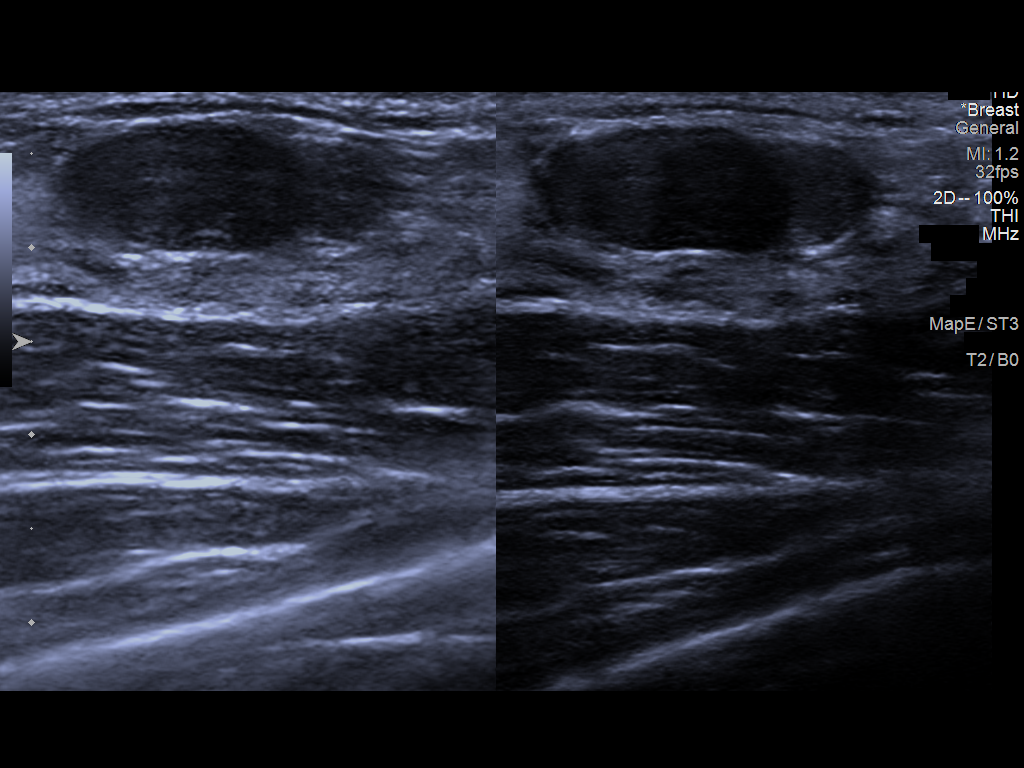
[im 2/5]
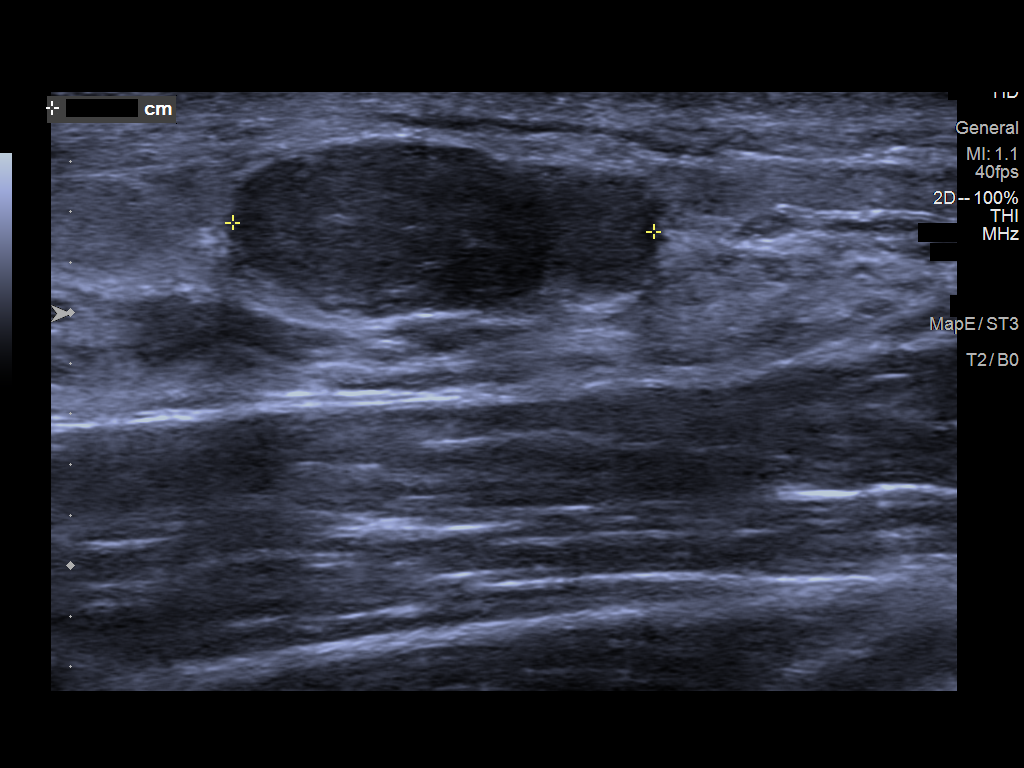
[im 3/5]
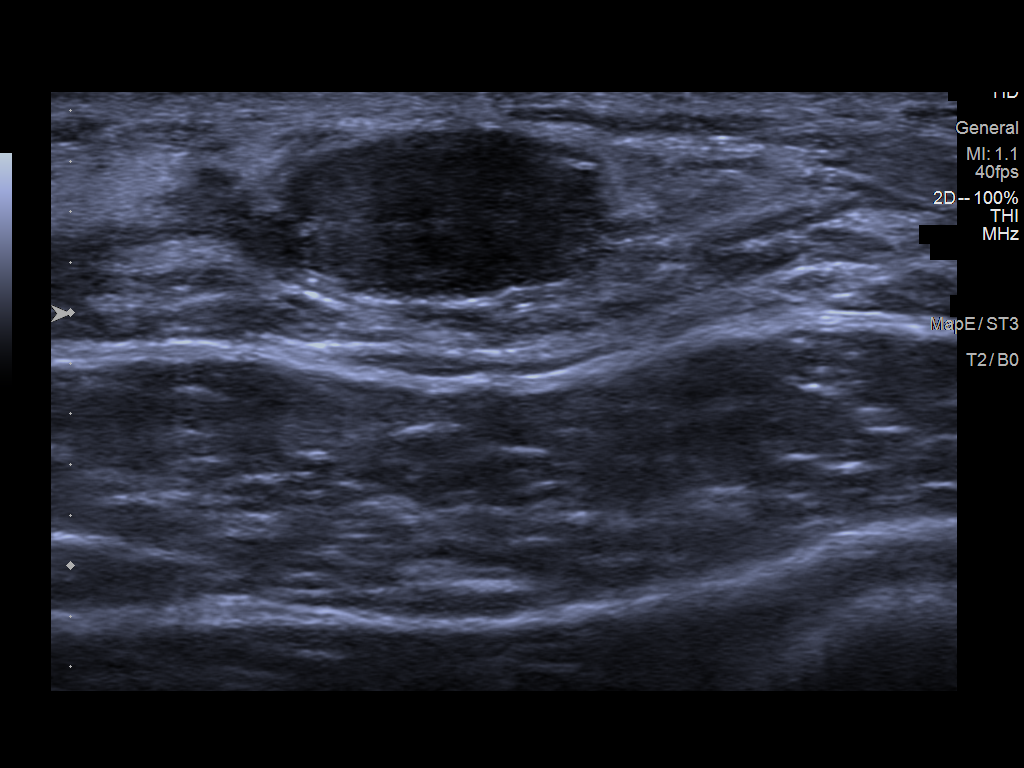
[im 4/5]
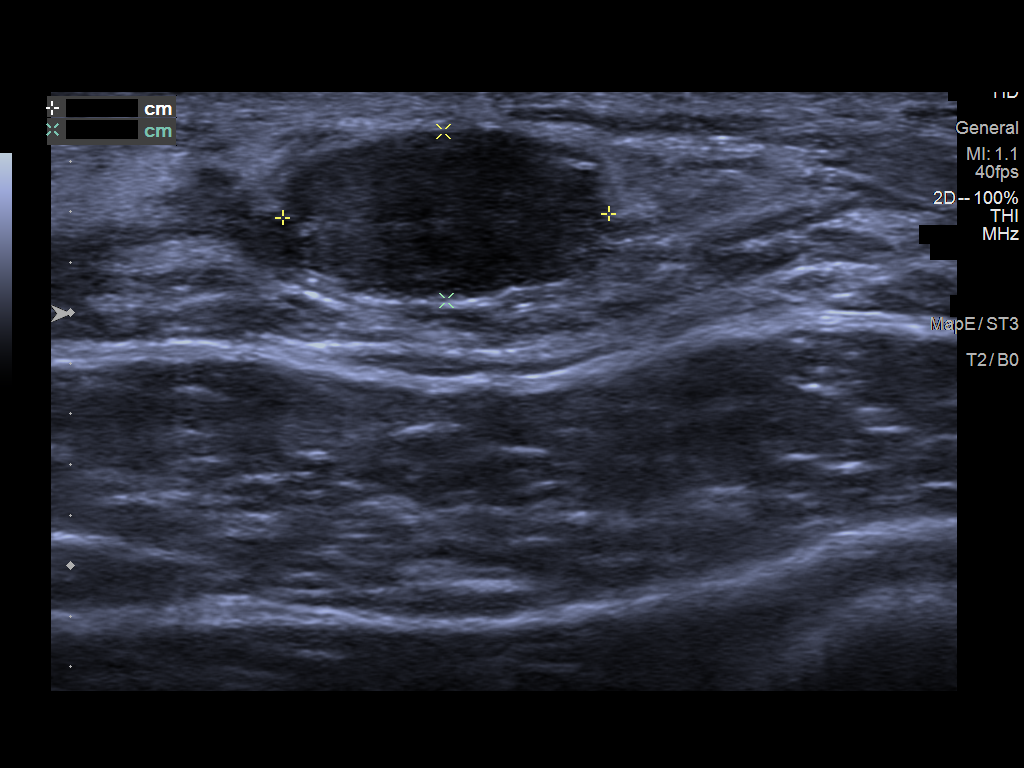
[im 5/5]
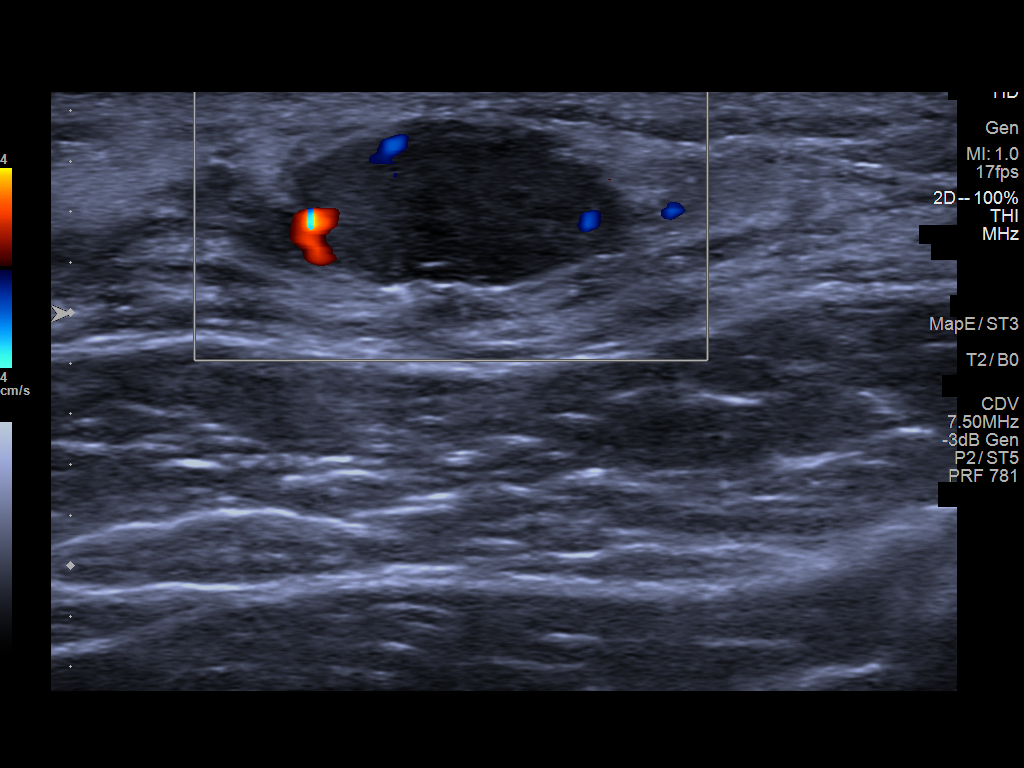

[5 of 5 positions shown; findings below may reference images not displayed]

FINDINGS: Targeted ultrasound was performed of the RIGHT breast. At 1 o'clock
3 cm from the nipple, there is revisualization of an oval
circumscribed hypoechoic mass. It measures 17 by 13 x 7 mm,
previously 13 by 11 by 6 mm in February 2020.
IMPRESSION: 1. Mild increase in size of a 17 mm RIGHT breast mass at 1 o'clock 3
cm from the nipple. Given increase in size, recommend
ultrasound-guided biopsy for definitive characterization.

RECOMMENDATION:
RIGHT breast ultrasound-guided biopsy x1

I have discussed the findings and recommendations with the patient
and patient's mother. The biopsy procedure was discussed with the
patient and questions were answered. Patient expressed their
understanding of the biopsy recommendation. Patient will be
scheduled for biopsy at her earliest convenience by the schedulers.
Ordering provider will be notified. If applicable, a reminder letter
will be sent to the patient regarding the next appointment.

BI-RADS CATEGORY  4: Suspicious.

## 2023-10-08 ENCOUNTER — Ambulatory Visit: Payer: Self-pay | Admitting: Surgery

## 2023-10-08 NOTE — H&P (Signed)
 History of Present Illness: Natalie Mclaughlin is a 18 y.o. female who is seen today as an office consultation for evaluation of New Consultation   She has had a mass on her right flank, which she first noticed about a year ago.  She feels that has been enlarging since that time.  It causes her some pain and discomfort when she lays on it.  She had a CT scan in October, which showed a fatty mass consistent with a lipoma.  She was referred to discuss surgical excision.   Is otherwise in good health.  She is currently in school and is very active.     Review of Systems: A complete review of systems was obtained from the patient.  I have reviewed this information and discussed as appropriate with the patient.  See HPI as well for other ROS.     Medical History: Past Medical History Past Medical History: Diagnosis Date  Anemia    Anxiety    Arrhythmia    Asthma, unspecified asthma severity, unspecified whether complicated, unspecified whether persistent (HHS-HCC)    Sleep apnea        Problem List Patient Active Problem List Diagnosis  Chronic migraine w/o aura w/o status migrainosus, not intractable  Closed head injury with concussion  History of concussion  Test anxiety      Past Surgical History Past Surgical History: Procedure Laterality Date  HERNIA REPAIR      TONSILLECTOMY AND ADENOIDECTOMY          Allergies Allergies Allergen Reactions  Apple Anaphylaxis  Other Anaphylaxis     Seafood  Peanut Anaphylaxis  Peanut Oil Anaphylaxis      Medications Ordered Prior to Encounter Current Outpatient Medications on File Prior to Visit Medication Sig Dispense Refill  cetirizine (ZYRTEC) 10 MG tablet Take by mouth.      montelukast (SINGULAIR) 5 MG chewable tablet Take by mouth.      albuterol (PROVENTIL HFA) 90 mcg/actuation inhaler Inhale into the lungs. (Patient not taking: Reported on 10/08/2023)      EPINEPHrine (EPIPEN) 0.3 mg/0.3 mL pen injector See admin  instructions. (Patient not taking: Reported on 10/08/2023)      ferrous sulfate 325 (65 FE) MG tablet Take 325 mg by mouth once daily (Patient not taking: Reported on 10/08/2023)      fluocinolone and shower cap (DERMA-SMOOTHE) 0.01 % scalp oil Apply topically 2 (two) times daily. (Patient not taking: Reported on 10/08/2023) 120 mL 11  fluticasone (FLONASE) 50 mcg/actuation nasal spray by Nasal route. (Patient not taking: Reported on 10/08/2023)      ketoconazole (NIZORAL) 2 % shampoo Leave on for 5 minutes, then rinse. (Patient not taking: Reported on 10/08/2023) 120 mL 11  mometasone (NASONEX) 50 mcg/actuation nasal spray by Nasal route. (Patient not taking: Reported on 10/08/2023)      nystatin (MYCOSTATIN) 100,000 unit/gram cream APPLY TOPICALLY THREE TIMES A DAY X 7 DAYS (Patient not taking: Reported on 10/08/2023)   0  ondansetron (ZOFRAN ODT) 4 MG disintegrating tablet Place 1 tablet under the tongue at onset of nausea. May repeat in 6-8 hours if needed. (Patient not taking: Reported on 10/08/2023)      promethazine (PHENERGAN) 6.25 mg/5 mL syrup Give 10ml at onset of nausea. May repeat every 6-8 hours as needed (Patient not taking: Reported on 10/08/2023)      rizatriptan (MAXALT-MLT) 5 MG disintegrating tablet Take by mouth (Patient not taking: Reported on 10/08/2023)      tacrolimus (PROTOPIC) 0.03 %  ointment APPLY TOPICALLY TO AFFECTED AREAS TWICE A DAY (Patient not taking: Reported on 10/08/2023)   3  topiramate (TOPAMAX SPRINKLE) 25 MG capsule Take 1 capsule in the morning, Take 2 capsules in the evening (Patient not taking: Reported on 10/08/2023)      TRANSDERM-SCOP 1.5 mg (1 mg over 3 days) patch PLACE ONE PATCH ON SKIN 1 HOUR PRIOR TO TRAVEL. REMOVE BEFORE OR AT 72 HOURS. (Patient not taking: Reported on 10/08/2023)   1    No current facility-administered medications on file prior to visit.      Family History Family History Problem Relation Age of Onset  High blood pressure  (Hypertension) Mother    Hyperlipidemia (Elevated cholesterol) Mother    Diabetes Mother        Tobacco Use History Social History    Tobacco Use Smoking Status Never Smokeless Tobacco Never      Social History Social History    Socioeconomic History  Marital status: Single Tobacco Use  Smoking status: Never  Smokeless tobacco: Never Substance and Sexual Activity  Alcohol use: Never  Drug use: Never      Objective:   Vitals:   10/08/23 1518 BP: 120/76 Pulse: (!) 120 Temp: 36.6 C (97.9 F) SpO2: 94% Weight: 59 kg (130 lb) Height: 157.5 cm (5\' 2" ) PainSc: 0-No pain PainLoc: Abdomen   Body mass index is 23.78 kg/m.   Physical Exam Vitals reviewed.  Constitutional:      General: She is not in acute distress.    Appearance: Normal appearance.  HENT:     Head: Normocephalic and atraumatic.  Cardiovascular:     Rate and Rhythm: Regular rhythm.  Pulmonary:     Effort: Pulmonary effort is normal. No respiratory distress.     Breath sounds: Normal breath sounds.  Musculoskeletal:     Comments: Subcutaneous mass on the right posterior flank, freely mobile, about 8cm in diameter, no overlying skin changes.  Skin:    General: Skin is warm and dry.  Neurological:     General: No focal deficit present.     Mental Status: She is alert and oriented to person, place, and time.            Labs, Imaging and Diagnostic Testing: CT abd/pelvis 04/17/23: IMPRESSION: 1. Right flank fat density lesion consistent with a lipoma. 2. Otherwise no acute abdominal or pelvic pathology identified.     Assessment and Plan:   Assessment Diagnoses and all orders for this visit:   Lipoma of torso     18 yo female with a large lipoma on the right flank. It is causing her discomfort and she would like to have it removed. I reviewed the details of surgical excision, including the possibility of having a small drain postoperatively to prevent seroma formation. We reviewed  the anticipated recovery course; she will need to avoid excessive activity for a few weeks after surgery. She expressed understanding and agrees to proceed. Will schedule in late May or June after she graduates to prevent interruptions in her school and activities. She will be contacted to schedule an elective surgery date. All questions were answered.  Sophronia Simas, MD [facility] General, Hepatobiliary and Pancreatic Surgery 10/08/23 3:42 PM

## 2023-12-17 NOTE — Progress Notes (Signed)
 Surgical Instructions   Your procedure is scheduled on Friday, June 6th, 2025. Report to [facility] Main Entrance "A" at 5:30 A.M., then check in with the Admitting office. Any questions or running late day of surgery: call [phone]  Questions prior to your surgery date: call [phone], Monday-Friday, 8am-4pm. If you experience any cold or flu symptoms such as cough, fever, chills, shortness of breath, etc. between now and your scheduled surgery, please notify us  at the above number.     Remember:  Do not eat after midnight the night before your surgery   You may drink clear liquids until 4:30 the morning of your surgery.   Clear liquids allowed are: Water, Non-Citrus Juices (without pulp), Carbonated Beverages, Clear Tea (no milk, honey, etc.), Black Coffee Only (NO MILK, CREAM OR POWDERED CREAMER of any kind), and Gatorade.    Take these medicines the morning of surgery with A SIP OF WATER: Cetirizine (Zyrtec)   May take these medicines IF NEEDED: Proventil HFA - bring with you on day of surgery Rizatriptan  (Maxalt -MLT)    One week prior to surgery, STOP taking any Aspirin (unless otherwise instructed by your surgeon) Aleve, Naproxen, Ibuprofen, Motrin, Advil, Goody's, BC's, all herbal medications, fish oil, and non-prescription vitamins.                     Do NOT Smoke (Tobacco/Vaping) for 24 hours prior to your procedure.  If you use a CPAP at night, you may bring your mask/headgear for your overnight stay.   You will be asked to remove any contacts, glasses, piercing's, hearing aid's, dentures/partials prior to surgery. Please bring cases for these items if needed.    Patients discharged the day of surgery will not be allowed to drive home, and someone needs to stay with them for 24 hours.  SURGICAL WAITING ROOM VISITATION Patients may have no more than 2 support people in the waiting area - these visitors may rotate.   Pre-op nurse will coordinate an  appropriate time for 1 ADULT support person, who may not rotate, to accompany patient in pre-op.  Children under the age of 41 must have an adult with them who is not the patient and must remain in the main waiting area with an adult.  If the patient needs to stay at the hospital during part of their recovery, the visitor guidelines for inpatient rooms apply.  Please refer to the [facility] website for the visitor guidelines for any additional information.   If you received a COVID test during your pre-op visit  it is requested that you wear a mask when out in public, stay away from anyone that may not be feeling well and notify your surgeon if you develop symptoms. If you have been in contact with anyone that has tested positive in the last 10 days please notify you surgeon.      Pre-operative CHG Bathing Instructions   You can play a key role in reducing the risk of infection after surgery. Your skin needs to be as free of germs as possible. You can reduce the number of germs on your skin by washing with CHG (chlorhexidine gluconate) soap before surgery. CHG is an antiseptic soap that kills germs and continues to kill germs even after washing.   DO NOT use if you have an allergy to chlorhexidine/CHG or antibacterial soaps. If your skin becomes reddened or irritated, stop using the CHG and notify one of our RNs at [phone].  TAKE A SHOWER THE NIGHT BEFORE SURGERY AND THE DAY OF SURGERY    Please keep in mind the following:  DO NOT shave, including legs and underarms, 48 hours prior to surgery.   You may shave your face before/day of surgery.  Place clean sheets on your bed the night before surgery Use a clean washcloth (not used since being washed) for each shower. DO NOT sleep with pet's night before surgery.  CHG Shower Instructions:  Wash your face and private area with normal soap. If you choose to wash your hair, wash first with your normal shampoo.  After you use  shampoo/soap, rinse your hair and body thoroughly to remove shampoo/soap residue.  Turn the water OFF and apply half the bottle of CHG soap to a CLEAN washcloth.  Apply CHG soap ONLY FROM YOUR NECK DOWN TO YOUR TOES (washing for 3-5 minutes)  DO NOT use CHG soap on face, private areas, open wounds, or sores.  Pay special attention to the area where your surgery is being performed.  If you are having back surgery, having someone wash your back for you may be helpful. Wait 2 minutes after CHG soap is applied, then you may rinse off the CHG soap.  Pat dry with a clean towel  Put on clean pajamas    Additional instructions for the day of surgery: DO NOT APPLY any lotions, deodorants, cologne, or perfumes.   Do not wear jewelry or makeup Do not wear nail polish, gel polish, artificial nails, or any other type of covering on natural nails (fingers and toes) Do not bring valuables to the hospital. [facility] is not responsible for valuables/personal belongings. Put on clean/comfortable clothes.  Please brush your teeth.  Ask your nurse before applying any prescription medications to the skin.

## 2023-12-18 ENCOUNTER — Inpatient Hospital Stay (HOSPITAL_COMMUNITY)
Admission: RE | Admit: 2023-12-18 | Discharge: 2023-12-18 | Disposition: A | Discharge status: Home / Self Care | Source: Ambulatory Visit

## 2023-12-19 ENCOUNTER — Encounter (HOSPITAL_COMMUNITY): Payer: Self-pay

## 2023-12-19 ENCOUNTER — Other Ambulatory Visit: Payer: Self-pay

## 2023-12-19 ENCOUNTER — Encounter (HOSPITAL_COMMUNITY)
Admission: RE | Admit: 2023-12-19 | Discharge: 2023-12-19 | Disposition: A | Discharge status: Home / Self Care | Source: Ambulatory Visit | Attending: Surgery | Admitting: Surgery

## 2023-12-19 VITALS — BP 133/75 | HR 83 | Temp 98.1°F | Resp 17 | Ht 62.0 in | Wt 131.9 lb

## 2023-12-19 DIAGNOSIS — Z01812 Encounter for preprocedural laboratory examination: Secondary | ICD-10-CM | POA: Diagnosis present

## 2023-12-19 DIAGNOSIS — Z01818 Encounter for other preprocedural examination: Secondary | ICD-10-CM

## 2023-12-19 LAB — CBC
HCT: 35.9 % — ABNORMAL LOW (ref 36.0–46.0)
Hemoglobin: 10.4 g/dL — ABNORMAL LOW (ref 12.0–15.0)
MCH: 20.8 pg — ABNORMAL LOW (ref 26.0–34.0)
MCHC: 29 g/dL — ABNORMAL LOW (ref 30.0–36.0)
MCV: 71.7 fL — ABNORMAL LOW (ref 80.0–100.0)
Platelets: 366 10*3/uL (ref 150–400)
RBC: 5.01 MIL/uL (ref 3.87–5.11)
RDW: 16.8 % — ABNORMAL HIGH (ref 11.5–15.5)
WBC: 6.2 10*3/uL (ref 4.0–10.5)
nRBC: 0 % (ref 0.0–0.2)

## 2023-12-19 NOTE — Progress Notes (Signed)
 PCP - Dr. Starling Eck Page Cardiologist - denies  PPM/ICD - denies Device Orders - na Rep Notified - na  Chest x-ray - na EKG - 04/26/2023 Stress Test -  ECHO -  Cardiac Cath -   Sleep Study - Had a sleep study as a child, no diagnoses of sleep apnea, had adnoids removes CPAP - denies  Non-diabetic  Blood Thinner Instructions: denies Aspirin Instructions:denies  ERAS Protcol - Clears until 0430  Anesthesia review: No  Patient denies shortness of breath, fever, cough and chest pain at PAT appointment   All instructions explained to the patient, with a verbal understanding of the material. Patient agrees to go over the instructions while at home for a better understanding. Patient also instructed to self quarantine after being tested for COVID-19. The opportunity to ask questions was provided.

## 2023-12-21 ENCOUNTER — Ambulatory Visit (HOSPITAL_COMMUNITY)
Admission: RE | Admit: 2023-12-21 | Discharge: 2023-12-21 | Disposition: A | Discharge status: Home / Self Care | Attending: Surgery | Admitting: Surgery

## 2023-12-21 ENCOUNTER — Encounter (HOSPITAL_COMMUNITY): Payer: Self-pay | Admitting: Surgery

## 2023-12-21 ENCOUNTER — Ambulatory Visit (HOSPITAL_COMMUNITY)

## 2023-12-21 ENCOUNTER — Other Ambulatory Visit: Payer: Self-pay

## 2023-12-21 ENCOUNTER — Encounter (HOSPITAL_COMMUNITY)
Admission: RE | Disposition: A | Discharge status: Home / Self Care | Payer: Self-pay | Source: Home / Self Care | Attending: Surgery

## 2023-12-21 DIAGNOSIS — F419 Anxiety disorder, unspecified: Secondary | ICD-10-CM | POA: Diagnosis not present

## 2023-12-21 DIAGNOSIS — Z79899 Other long term (current) drug therapy: Secondary | ICD-10-CM | POA: Insufficient documentation

## 2023-12-21 DIAGNOSIS — R519 Headache, unspecified: Secondary | ICD-10-CM | POA: Insufficient documentation

## 2023-12-21 DIAGNOSIS — D171 Benign lipomatous neoplasm of skin and subcutaneous tissue of trunk: Secondary | ICD-10-CM

## 2023-12-21 HISTORY — PX: EXCISION MASS ABDOMINAL: SHX6701

## 2023-12-21 LAB — POCT PREGNANCY, URINE: Preg Test, Ur: NEGATIVE

## 2023-12-21 SURGERY — EXCISION, MASS, TORSO
Anesthesia: General | Site: Flank | Laterality: Right

## 2023-12-21 MED ORDER — LACTATED RINGERS IV SOLN: INTRAVENOUS | Status: DC

## 2023-12-21 MED ORDER — EPHEDRINE 5 MG/ML INJ
INTRAVENOUS | Status: AC
Start: 2023-12-21 — End: ?
  Filled 2023-12-21: qty 5

## 2023-12-21 MED ORDER — PHENYLEPHRINE HCL-NACL 20-0.9 MG/250ML-% IV SOLN: INTRAVENOUS | Status: DC | PRN

## 2023-12-21 MED ORDER — OXYCODONE HCL 5 MG PO TABS: 5.0000 mg | ORAL_TABLET | Freq: Once | ORAL | Status: DC | PRN

## 2023-12-21 MED ORDER — PHENYLEPHRINE 80 MCG/ML (10ML) SYRINGE FOR IV PUSH (FOR BLOOD PRESSURE SUPPORT)
PREFILLED_SYRINGE | INTRAVENOUS | Status: DC | PRN
  Administered 2023-12-21: 40 ug via INTRAVENOUS
  Administered 2023-12-21: 80 ug via INTRAVENOUS
  Administered 2023-12-21: 40 ug via INTRAVENOUS
  Administered 2023-12-21: 80 ug via INTRAVENOUS

## 2023-12-21 MED ORDER — OXYCODONE HCL 5 MG/5ML PO SOLN: 5.0000 mg | Freq: Once | ORAL | Status: DC | PRN

## 2023-12-21 MED ORDER — 0.9 % SODIUM CHLORIDE (POUR BTL) OPTIME
TOPICAL | Status: DC | PRN
  Administered 2023-12-21: 1000 mL

## 2023-12-21 MED ORDER — TRAMADOL HCL 50 MG PO TABS: 50.0000 mg | ORAL_TABLET | Freq: Four times a day (QID) | ORAL | 0 refills | Status: AC | PRN

## 2023-12-21 MED ORDER — EPHEDRINE SULFATE-NACL 50-0.9 MG/10ML-% IV SOSY
PREFILLED_SYRINGE | INTRAVENOUS | Status: DC | PRN
  Administered 2023-12-21: 5 mg via INTRAVENOUS

## 2023-12-21 MED ORDER — ONDANSETRON HCL 4 MG/2ML IJ SOLN
INTRAMUSCULAR | Status: AC
Start: 2023-12-21 — End: ?
  Filled 2023-12-21: qty 2

## 2023-12-21 MED ORDER — LIDOCAINE 2% (20 MG/ML) 5 ML SYRINGE
INTRAMUSCULAR | Status: AC
  Filled 2023-12-21: qty 5

## 2023-12-21 MED ORDER — PROPOFOL 10 MG/ML IV BOLUS
INTRAVENOUS | Status: AC
  Filled 2023-12-21: qty 20

## 2023-12-21 MED ORDER — DEXMEDETOMIDINE HCL IN NACL 80 MCG/20ML IV SOLN
INTRAVENOUS | Status: AC
  Filled 2023-12-21: qty 20

## 2023-12-21 MED ORDER — ORAL CARE MOUTH RINSE: 15.0000 mL | Freq: Once | OROMUCOSAL | Status: AC

## 2023-12-21 MED ORDER — MIDAZOLAM HCL 2 MG/2ML IJ SOLN
INTRAMUSCULAR | Status: DC | PRN
  Administered 2023-12-21: 2 mg via INTRAVENOUS

## 2023-12-21 MED ORDER — DEXAMETHASONE SODIUM PHOSPHATE 10 MG/ML IJ SOLN
INTRAMUSCULAR | Status: DC | PRN
  Administered 2023-12-21: 10 mg via INTRAVENOUS

## 2023-12-21 MED ORDER — PHENYLEPHRINE 80 MCG/ML (10ML) SYRINGE FOR IV PUSH (FOR BLOOD PRESSURE SUPPORT)
PREFILLED_SYRINGE | INTRAVENOUS | Status: AC
  Filled 2023-12-21: qty 10

## 2023-12-21 MED ORDER — SCOPOLAMINE 1 MG/3DAYS TD PT72
1.0000 | MEDICATED_PATCH | TRANSDERMAL | Status: DC
  Filled 2023-12-21: qty 1

## 2023-12-21 MED ORDER — FENTANYL CITRATE (PF) 100 MCG/2ML IJ SOLN: 25.0000 ug | INTRAMUSCULAR | Status: DC | PRN

## 2023-12-21 MED ORDER — PROPOFOL 10 MG/ML IV BOLUS
INTRAVENOUS | Status: DC | PRN
  Administered 2023-12-21: 200 mg via INTRAVENOUS

## 2023-12-21 MED ORDER — DEXAMETHASONE SODIUM PHOSPHATE 10 MG/ML IJ SOLN
INTRAMUSCULAR | Status: AC
  Filled 2023-12-21: qty 1

## 2023-12-21 MED ORDER — SUGAMMADEX SODIUM 200 MG/2ML IV SOLN
INTRAVENOUS | Status: DC | PRN
  Administered 2023-12-21: 200 mg via INTRAVENOUS

## 2023-12-21 MED ORDER — LIDOCAINE 2% (20 MG/ML) 5 ML SYRINGE
INTRAMUSCULAR | Status: DC | PRN
  Administered 2023-12-21: 60 mg via INTRAVENOUS

## 2023-12-21 MED ORDER — CHLORHEXIDINE GLUCONATE CLOTH 2 % EX PADS: 6.0000 | MEDICATED_PAD | Freq: Once | CUTANEOUS | Status: DC

## 2023-12-21 MED ORDER — BUPIVACAINE-EPINEPHRINE (PF) 0.25% -1:200000 IJ SOLN
INTRAMUSCULAR | Status: DC | PRN
  Administered 2023-12-21: 30 mL

## 2023-12-21 MED ORDER — VANCOMYCIN HCL IN DEXTROSE 1-5 GM/200ML-% IV SOLN
1000.0000 mg | INTRAVENOUS | Status: AC
  Administered 2023-12-21: 1000 mg via INTRAVENOUS
  Filled 2023-12-21: qty 200

## 2023-12-21 MED ORDER — MIDAZOLAM HCL 2 MG/2ML IJ SOLN
INTRAMUSCULAR | Status: AC
Start: 2023-12-21 — End: ?
  Filled 2023-12-21: qty 2

## 2023-12-21 MED ORDER — DROPERIDOL 2.5 MG/ML IJ SOLN: 0.6250 mg | Freq: Once | INTRAMUSCULAR | Status: DC | PRN

## 2023-12-21 MED ORDER — ONDANSETRON HCL 4 MG/2ML IJ SOLN
INTRAMUSCULAR | Status: DC | PRN
  Administered 2023-12-21: 4 mg via INTRAVENOUS

## 2023-12-21 MED ORDER — FENTANYL CITRATE (PF) 250 MCG/5ML IJ SOLN
INTRAMUSCULAR | Status: AC
Start: 2023-12-21 — End: ?
  Filled 2023-12-21: qty 5

## 2023-12-21 MED ORDER — FENTANYL CITRATE (PF) 250 MCG/5ML IJ SOLN
INTRAMUSCULAR | Status: DC | PRN
  Administered 2023-12-21: 50 ug via INTRAVENOUS

## 2023-12-21 MED ORDER — DEXMEDETOMIDINE HCL IN NACL 80 MCG/20ML IV SOLN
INTRAVENOUS | Status: DC | PRN
  Administered 2023-12-21: 16 ug via INTRAVENOUS

## 2023-12-21 MED ORDER — SODIUM CHLORIDE (PF) 0.9 % IJ SOLN
INTRAMUSCULAR | Status: AC
  Filled 2023-12-21: qty 10

## 2023-12-21 MED ORDER — BUPIVACAINE-EPINEPHRINE (PF) 0.25% -1:200000 IJ SOLN
INTRAMUSCULAR | Status: AC
Start: 2023-12-21 — End: ?
  Filled 2023-12-21: qty 30

## 2023-12-21 MED ORDER — CHLORHEXIDINE GLUCONATE 0.12 % MT SOLN
15.0000 mL | Freq: Once | OROMUCOSAL | Status: AC
  Administered 2023-12-21: 15 mL via OROMUCOSAL
  Filled 2023-12-21: qty 15

## 2023-12-21 MED ORDER — ACETAMINOPHEN 500 MG PO TABS
1000.0000 mg | ORAL_TABLET | ORAL | Status: AC
  Administered 2023-12-21: 1000 mg via ORAL
  Filled 2023-12-21: qty 2

## 2023-12-21 MED ORDER — ACETAMINOPHEN 10 MG/ML IV SOLN: 1000.0000 mg | Freq: Once | INTRAVENOUS | Status: DC | PRN

## 2023-12-21 MED ORDER — CELECOXIB 200 MG PO CAPS
200.0000 mg | ORAL_CAPSULE | ORAL | Status: AC
  Administered 2023-12-21: 200 mg via ORAL
  Filled 2023-12-21: qty 1

## 2023-12-21 SURGICAL SUPPLY — 37 items
BAG COUNTER SPONGE SURGICOUNT (BAG) ×1 IMPLANT
CANISTER SUCTION 3000ML PPV (SUCTIONS) ×1 IMPLANT
CHLORAPREP W/TINT 26 (MISCELLANEOUS) ×1 IMPLANT
CNTNR URN SCR LID CUP LEK RST (MISCELLANEOUS) IMPLANT
CONNECTOR 5 IN 1 STRAIGHT STRL (MISCELLANEOUS) IMPLANT
COVER SURGICAL LIGHT HANDLE (MISCELLANEOUS) ×1 IMPLANT
DERMABOND ADVANCED .7 DNX12 (GAUZE/BANDAGES/DRESSINGS) ×1 IMPLANT
DRAPE LAPAROSCOPIC ABDOMINAL (DRAPES) IMPLANT
DRAPE LAPAROTOMY 100X72 PEDS (DRAPES) IMPLANT
DRSG TEGADERM 4X4.75 (GAUZE/BANDAGES/DRESSINGS) IMPLANT
ELECTRODE REM PT RTRN 9FT ADLT (ELECTROSURGICAL) ×1 IMPLANT
GAUZE 4X4 16PLY ~~LOC~~+RFID DBL (SPONGE) IMPLANT
GAUZE SPONGE 4X4 12PLY STRL (GAUZE/BANDAGES/DRESSINGS) IMPLANT
GLOVE BIOGEL PI IND STRL 6 (GLOVE) ×1 IMPLANT
GLOVE BIOGEL PI MICRO STRL 5.5 (GLOVE) ×1 IMPLANT
GOWN STRL REUS W/ TWL LRG LVL3 (GOWN DISPOSABLE) ×1 IMPLANT
KIT BASIN OR (CUSTOM PROCEDURE TRAY) ×1 IMPLANT
KIT TURNOVER KIT B (KITS) ×1 IMPLANT
MARKER SKIN DUAL TIP RULER LAB (MISCELLANEOUS) ×1 IMPLANT
NDL HYPO 25GX1X1/2 BEV (NEEDLE) ×1 IMPLANT
NEEDLE HYPO 25GX1X1/2 BEV (NEEDLE) ×1 IMPLANT
NS IRRIG 1000ML POUR BTL (IV SOLUTION) ×1 IMPLANT
PACK GENERAL/GYN (CUSTOM PROCEDURE TRAY) ×1 IMPLANT
PAD ARMBOARD POSITIONER FOAM (MISCELLANEOUS) ×2 IMPLANT
PENCIL SMOKE EVACUATOR (MISCELLANEOUS) ×1 IMPLANT
SPECIMEN JAR SMALL (MISCELLANEOUS) ×1 IMPLANT
SPONGE T-LAP 4X18 ~~LOC~~+RFID (SPONGE) IMPLANT
STRIP CLOSURE SKIN 1/2X4 (GAUZE/BANDAGES/DRESSINGS) IMPLANT
SUT MON AB 4-0 PC3 18 (SUTURE) ×1 IMPLANT
SUT SILK 2 0 PERMA HAND 18 BK (SUTURE) IMPLANT
SUT VIC AB 3-0 SH 18 (SUTURE) IMPLANT
SUT VIC AB 3-0 SH 27X BRD (SUTURE) ×1 IMPLANT
SUT VIC AB 3-0 SH 8-18 (SUTURE) IMPLANT
SYR CONTROL 10ML LL (SYRINGE) ×1 IMPLANT
TOWEL GREEN STERILE (TOWEL DISPOSABLE) ×1 IMPLANT
TOWEL GREEN STERILE FF (TOWEL DISPOSABLE) ×1 IMPLANT
TUBE CONNECTING 12X1/4 (SUCTIONS) IMPLANT

## 2023-12-21 NOTE — Discharge Instructions (Addendum)
 [facility] DISCHARGE INSTRUCTIONS  Activity No heavy lifting greater than 15 pounds for 2 weeks after surgery. Ok to shower in 24 hours, but do not bathe or submerge incision underwater for 2 weeks. Do not drive while taking narcotic pain medication. You may drive when you are no longer taking prescription pain medication, you can comfortably wear a seatbelt, and you can safely maneuver your car and apply brakes.  Wound Care Your incisions are covered with skin glue called Dermabond. This will peel off on its own over time. You may shower and allow warm soapy water to run over your incisions. Gently pat dry. Do not submerge your incision underwater until cleared by your surgeon. Monitor your incision for any new redness, tenderness, or drainage. Many patients will experience some swelling and bruising at the incisions.  Ice packs will help.  Swelling and bruising can take several days to resolve.   Medications A  prescription for pain medication may be given to you upon discharge.  Take your pain medication as prescribed, if needed.  If narcotic pain medicine is not needed, then you may take acetaminophen  (Tylenol ) or ibuprofen (Advil) as needed. It is common to experience some constipation if taking pain medication after surgery.  Increasing fluid intake and taking a stool softener (such as Colace) will usually help or prevent this problem from occurring.  A mild laxative (Milk of Magnesia or Miralax) should be taken according to package directions if there are no bowel movements after 48 hours. Take your usually prescribed medications unless otherwise directed. If you need a refill on your pain medication, please contact your pharmacy.  They will contact our office to request authorization. Prescriptions will not be filled after 5 pm or on weekends.  When to Call Us : Fever greater than 100.5 New redness, drainage, or swelling at incision site Severe pain, nausea, or  vomiting Persistent bleeding from incisions   Follow-up You have an appointment scheduled with Dr. Leighton Punches on January 10, 2024 at 9:20am. This will be at the [facility] office at [address], [address], [place], [place]. Please arrive at least 15 minutes prior to your scheduled appointment time.   The clinic staff is available to answer your questions during regular business hours.  Please don't hesitate to call and ask to speak to one of the nurses for clinical concerns.  If you have a medical emergency, go to the nearest emergency room or call 911.  A surgeon from [facility] is always on call at the hospital  [address], [place], [place]  [address] ?  [address] [address], [place], [place]   [address] [phone] ? Toll Free: [phone] ? FAX [phone] Web site: [email or web address]      Managing Your Pain After Surgery Without Opioids    Thank you for participating in our program to help patients manage their pain after surgery without opioids. This is part of our effort to provide you with the best care possible, without exposing you or your family to the risk that opioids pose.  What pain can I expect after surgery? You can expect to have some pain after surgery. This is normal. The pain is typically worse the day after surgery, and quickly begins to get better. Many studies have found that many patients are able to manage their pain after surgery with Over-the-Counter (OTC) medications such as Tylenol  and Motrin. If you have a condition that does not allow you to  take Tylenol  or Motrin, notify your surgical team.  How will I manage my pain? The best strategy for controlling your pain after surgery is around the clock pain control with Tylenol  (acetaminophen ) and Motrin (ibuprofen or Advil). Alternating these medications with each other allows you to maximize your pain control. In addition to Tylenol  and Motrin, you can use  heating pads or ice packs on your incisions to help reduce your pain.  How will I alternate your regular strength over-the-counter pain medication? You will take a dose of pain medication every three hours. Start by taking 650 mg of Tylenol  (2 pills of 325 mg) 3 hours later take 600 mg of Motrin (3 pills of 200 mg) 3 hours after taking the Motrin take 650 mg of Tylenol  3 hours after that take 600 mg of Motrin.   - 1 -  See example - if your first dose of Tylenol  is at 12:00 PM   12:00 PM Tylenol  650 mg (2 pills of 325 mg)  3:00 PM Motrin 600 mg (3 pills of 200 mg)  6:00 PM Tylenol  650 mg (2 pills of 325 mg)  9:00 PM Motrin 600 mg (3 pills of 200 mg)  Continue alternating every 3 hours   We recommend that you follow this schedule around-the-clock for at least 3 days after surgery, or until you feel that it is no longer needed. Use the table on the last page of this handout to keep track of the medications you are taking. Important: Do not take more than 3000mg  of Tylenol  or 3200mg  of Motrin in a 24-hour period. Do not take ibuprofen/Motrin if you have a history of bleeding stomach ulcers, severe kidney disease, &/or actively taking a blood thinner  What if I still have pain? If you have pain that is not controlled with the over-the-counter pain medications (Tylenol  and Motrin or Advil) you might have what we call "breakthrough" pain. You will receive a prescription for a small amount of an opioid pain medication such as Oxycodone, Tramadol, or Tylenol  with Codeine. Use these opioid pills in the first 24 hours after surgery if you have breakthrough pain. Do not take more than 1 pill every 4-6 hours.  If you still have uncontrolled pain after using all opioid pills, don't hesitate to call our staff using the number provided. We will help make sure you are managing your pain in the best way possible, and if necessary, we can provide a prescription for additional pain medication.   Day 1     Time  Name of Medication Number of pills taken  Amount of Acetaminophen   Pain Level   Comments  AM PM       AM PM       AM PM       AM PM       AM PM       AM PM       AM PM       AM PM       Total Daily amount of Acetaminophen  Do not take more than  3,000 mg per day      Day 2    Time  Name of Medication Number of pills taken  Amount of Acetaminophen   Pain Level   Comments  AM PM       AM PM       AM PM       AM PM       AM PM  AM PM       AM PM       AM PM       Total Daily amount of Acetaminophen  Do not take more than  3,000 mg per day      Day 3    Time  Name of Medication Number of pills taken  Amount of Acetaminophen   Pain Level   Comments  AM PM       AM PM       AM PM       AM PM         AM PM       AM PM       AM PM       AM PM       Total Daily amount of Acetaminophen  Do not take more than  3,000 mg per day      Day 4    Time  Name of Medication Number of pills taken  Amount of Acetaminophen   Pain Level   Comments  AM PM       AM PM       AM PM       AM PM       AM PM       AM PM       AM PM       AM PM       Total Daily amount of Acetaminophen  Do not take more than  3,000 mg per day      Day 5    Time  Name of Medication Number of pills taken  Amount of Acetaminophen   Pain Level   Comments  AM PM       AM PM       AM PM       AM PM       AM PM       AM PM       AM PM       AM PM       Total Daily amount of Acetaminophen  Do not take more than  3,000 mg per day      Day 6    Time  Name of Medication Number of pills taken  Amount of Acetaminophen   Pain Level  Comments  AM PM       AM PM       AM PM       AM PM       AM PM       AM PM       AM PM       AM PM       Total Daily amount of Acetaminophen  Do not take more than  3,000 mg per day      Day 7    Time  Name of Medication Number of pills taken  Amount of Acetaminophen   Pain Level   Comments  AM PM       AM PM        AM PM       AM PM       AM PM       AM PM       AM PM       AM PM       Total Daily amount of Acetaminophen  Do not take more than  3,000 mg per day        For additional information about how and where  to safely dispose of unused opioid medications - [email or web address]  Disclaimer: This document contains information and/or instructional materials adapted from [facility] for the typical patient with your condition. It does not replace medical advice from your health care provider because your experience may differ from that of the typical patient. Talk to your health care provider if you have any questions about this document, your condition or your treatment plan. Adapted from [facility]

## 2023-12-21 NOTE — Anesthesia Procedure Notes (Signed)
 Procedure Name: Intubation Date/Time: 12/21/2023 7:45 AM  Performed by: Trenton Frock, CRNAPre-anesthesia Checklist: Patient identified, Emergency Drugs available, Suction available and Patient being monitored Patient Re-evaluated:Patient Re-evaluated prior to induction Oxygen Delivery Method: Circle system utilized Preoxygenation: Pre-oxygenation with 100% oxygen Induction Type: IV induction Ventilation: Mask ventilation without difficulty Laryngoscope Size: Mac and 3 Grade View: Grade I Tube type: Oral Tube size: 6.5 mm Number of attempts: 1 Airway Equipment and Method: Stylet and Bite block Placement Confirmation: ETT inserted through vocal cords under direct vision, positive ETCO2 and breath sounds checked- equal and bilateral Secured at: 21 cm Tube secured with: Tape Dental Injury: Teeth and Oropharynx as per pre-operative assessment

## 2023-12-21 NOTE — Op Note (Signed)
 Date: 12/21/23  Patient: Natalie Mclaughlin MRN: [id number]  Preoperative Diagnosis: Lipoma of right flank Postoperative Diagnosis: Same  Procedure: Excision of right flank lipoma (10 x 8 x 2cm)  Surgeon: Karleen Overall, MD  EBL: Minimal  Anesthesia: General  Specimens: Right flank mass  Indications: Natalie Mclaughlin is a 18 yo female who was referred with a symptomatic subcutaneous mass on the right flank. Prior CT scan was consistent with a benign lipoma. After a discussion of the risks and benefits of surgery, she elected to proceed with excision.   Findings: Well-circumscribed fatty mass consistent with a benign lipoma.  Procedure details: Informed consent was obtained in the preoperative area prior to the procedure. The patient was brought to the operating room, general anesthesia was induced, and the patient was placed in the left lateral decubitus position. Perioperative antibiotics were administered per SCIP guidelines. The right flank was prepped and draped in the usual sterile fashion. A pre-procedure timeout was taken verifying patient identity, surgical site and procedure to be performed.  A transverse skin incision was made along the natural tension lines of the skin overlying the palpable mass. The subcutaneous tissue was divided with cautery. Scarpa's fascia was opened, and deep to this the mass was encountered. It was fatty, smooth and encapsulated, consistent with a benign lipoma. The mass was bluntly dissected out of the surrounding tissue. Thin filmy adhesions were divided with cautery. The mass was adjacent to but not invading the underlying muscle fascia. The mass was taken off the fascia using blunt dissection and cautery. The specimen was completely excised, measured and sent for routine pathology. The wound was examined and appeared hemostatic. The wound was infiltrated with 30mL 0.25% bupivicaine with epinephrine. The deep subcutaneous layer was closed with interrupted 3-0 Vicryl  figure-of-eight sutures to close down the dead space. Scarpa's layer was closed with interrupted 3-0 Vicryl sutures. The deep dermis was closed with interrupted 3-0 Vicryl sutures, and the subcuticular layer was closed with running 4-0 monocryl suture. Dermabond was applied.  The patient tolerated the procedure well with no apparent complications. All counts were correct x2 at the end of the procedure. The patient was extubated and taken to PACU in stable condition.  Karleen Overall, MD 12/21/23 8:28 AM

## 2023-12-21 NOTE — H&P (Signed)
 Natalie Mclaughlin is an 18 y.o. female.   Chief Complaint: lipoma HPI: Natalie Mclaughlin is a 18 yo female who presents for excision of a lipoma. She has had a mass on her right flank, which she first noticed about a year ago.  She feels that has been enlarging since that time.  It causes her some pain and discomfort when she lays on it.  She had a CT scan in October, which showed a fatty mass consistent with a lipoma.  She was referred to discuss surgical excision.     Past Medical History:  Diagnosis Date   Headache     Past Surgical History:  Procedure Laterality Date   HERNIA REPAIR     18 Yeras old and 2 repairs at 18 years old   TONSILLECTOMY AND ADENOIDECTOMY     18 Years old   WISDOM TOOTH EXTRACTION      Family History  Problem Relation Age of Onset   Migraines Mother    Stroke Mother    Migraines Father    Migraines Maternal Grandfather    Social History:  reports that she has never smoked. She has never used smokeless tobacco. She reports that she does not drink alcohol and does not use drugs.  Allergies:  Allergies  Allergen Reactions   Apple Anaphylaxis   Apple Juice Anaphylaxis   Cephalexin Swelling   Other Anaphylaxis    Seafood, Trees, Pet dander   Peanut Oil Anaphylaxis   Peanut-Containing Drug Products Anaphylaxis   Shellfish Allergy Swelling    Medications Prior to Admission  Medication Sig Dispense Refill   Aspirin-Acetaminophen -Caffeine (EXCEDRIN PO) Take 1 tablet by mouth daily as needed (migraine).     cetirizine (ZYRTEC) 10 MG tablet Take 10 mg by mouth daily.  5   EPINEPHrine 0.3 mg/0.3 mL IJ SOAJ injection Inject 0.3 mg into the muscle as needed for anaphylaxis.  0   montelukast (SINGULAIR) 10 MG tablet Take 10 mg by mouth daily.  5   norgestimate-ethinyl estradiol (ORTHO-CYCLEN) 0.25-35 MG-MCG tablet Take 1 tablet by mouth daily.     rizatriptan  (MAXALT -MLT) 5 MG disintegrating tablet Take 1 tablet (5 mg total) by mouth as needed for migraine. May  repeat in 2 hours if needed 10 tablet 6   ondansetron  (ZOFRAN ) 4 MG tablet Take 1 tablet (4 mg total) by mouth every 8 (eight) hours as needed for nausea or vomiting. (Patient not taking: Reported on 12/14/2023) 20 tablet 6   PROVENTIL HFA 108 (90 BASE) MCG/ACT inhaler Inhale 1-2 puffs into the lungs every 6 (six) hours as needed for wheezing or shortness of breath.  0    Results for orders placed or performed during the hospital encounter of 12/21/23 (from the past 48 hours)  Pregnancy, urine POC     Status: None   Collection Time: 12/21/23  5:42 AM  Result Value Ref Range   Preg Test, Ur NEGATIVE NEGATIVE    Comment:        THE SENSITIVITY OF THIS METHODOLOGY IS >24 mIU/mL    No results found.  Review of Systems  Blood pressure 132/73, pulse 81, temperature 98.2 F (36.8 C), temperature source Oral, resp. rate 18, height 5\' 2"  (1.575 m), weight 59 kg, last menstrual period 12/05/2023, SpO2 100%. Physical Exam Vitals reviewed.  Constitutional:      Appearance: Normal appearance.  HENT:     Head: Normocephalic and atraumatic.  Pulmonary:     Effort: Pulmonary effort is normal. No respiratory distress.  Abdominal:     General: There is no distension.     Palpations: Abdomen is soft.     Comments: Soft mass on right posterolateral flank, mobile, consistent with a lipoma.  Skin:    General: Skin is warm and dry.  Neurological:     General: No focal deficit present.     Mental Status: She is alert and oriented to person, place, and time.      Assessment/Plan 18 yo female with a large lipoma of the right flank. Proceed to the OR for excision. Procedure details were reviewed with the patient and her mother and informed consent obtained. Plan for discharge home postoperatively.  Lujean Sake, MD 12/21/2023, 7:17 AM

## 2023-12-21 NOTE — Transfer of Care (Signed)
 Immediate Anesthesia Transfer of Care Note  Patient: Natalie Mclaughlin  Procedure(s) Performed: EXCISION, MASS, TORSO (Right: Flank)  Patient Location: PACU  Anesthesia Type:General  Level of Consciousness: drowsy and patient cooperative  Airway & Oxygen Therapy: Patient Spontanous Breathing and Patient connected to nasal cannula oxygen  Post-op Assessment: Report given to RN, Post -op Vital signs reviewed and stable, Patient moving all extremities X 4, and Patient able to stick tongue midline  Post vital signs: Reviewed and stable  Last Vitals:  Vitals Value Taken Time  BP 110/46 12/21/23 0845  Temp 36.4 C 12/21/23 0841  Pulse 96 12/21/23 0846  Resp 29 12/21/23 0846  SpO2 99 % 12/21/23 0846  Vitals shown include unfiled device data.  Last Pain:  Vitals:   12/21/23 0610  TempSrc:   PainSc: 0-No pain      Patients Stated Pain Goal: 0 (12/21/23 0559)  Complications: No notable events documented.

## 2023-12-21 NOTE — Anesthesia Preprocedure Evaluation (Addendum)
 Anesthesia Evaluation  Patient identified by MRN, date of birth, ID band Patient awake    Reviewed: Allergy & Precautions, NPO status , Patient's Chart, lab work & pertinent test results  Airway Mallampati: I  TM Distance: >3 FB Neck ROM: Full    Dental  (+) Teeth Intact, Dental Advisory Given   Pulmonary    breath sounds clear to auscultation       Cardiovascular negative cardio ROS  Rhythm:Regular Rate:Normal     Neuro/Psych  Headaches  Anxiety        GI/Hepatic negative GI ROS, Neg liver ROS,,,  Endo/Other  negative endocrine ROS    Renal/GU negative Renal ROS     Musculoskeletal negative musculoskeletal ROS (+)    Abdominal   Peds  Hematology negative hematology ROS (+)   Anesthesia Other Findings   Reproductive/Obstetrics                             Anesthesia Physical Anesthesia Plan  ASA: 1  Anesthesia Plan: General   Post-op Pain Management: Celebrex PO (pre-op)* and Tylenol  PO (pre-op)*   Induction: Intravenous  PONV Risk Score and Plan: 4 or greater and Ondansetron , Dexamethasone, Midazolam and Scopolamine patch - Pre-op  Airway Management Planned: Oral ETT  Additional Equipment: None  Intra-op Plan:   Post-operative Plan: Extubation in OR  Informed Consent: I have reviewed the patients History and Physical, chart, labs and discussed the procedure including the risks, benefits and alternatives for the proposed anesthesia with the patient or authorized representative who has indicated his/her understanding and acceptance.       Plan Discussed with: CRNA  Anesthesia Plan Comments:        Anesthesia Quick Evaluation

## 2023-12-21 NOTE — Anesthesia Postprocedure Evaluation (Signed)
 Anesthesia Post Note  Patient: Armetta Harston  Procedure(s) Performed: EXCISION, MASS, TORSO (Right: Flank)     Patient location during evaluation: PACU Anesthesia Type: General Level of consciousness: awake and alert Pain management: pain level controlled Vital Signs Assessment: post-procedure vital signs reviewed and stable Respiratory status: spontaneous breathing, nonlabored ventilation, respiratory function stable and patient connected to nasal cannula oxygen Cardiovascular status: blood pressure returned to baseline and stable Postop Assessment: no apparent nausea or vomiting Anesthetic complications: no  No notable events documented.  Last Vitals:  Vitals:   12/21/23 0915 12/21/23 0945  BP: (!) 141/79 123/73  Pulse: 99 84  Resp: 18 11  Temp:  (!) 36.4 C  SpO2: 99% 98%    Last Pain:  Vitals:   12/21/23 0945  TempSrc:   PainSc: 0-No pain                 Willian Harrow

## 2023-12-22 ENCOUNTER — Encounter (HOSPITAL_COMMUNITY): Payer: Self-pay | Admitting: Surgery

## 2023-12-24 LAB — SURGICAL PATHOLOGY

## 2024-01-03 ENCOUNTER — Telehealth: Payer: Self-pay | Admitting: Primary Care

## 2024-01-03 ENCOUNTER — Telehealth: Payer: Self-pay | Admitting: Pediatrics

## 2024-01-03 NOTE — Telephone Encounter (Signed)
 Yes, happy to accept patient.

## 2024-01-03 NOTE — Telephone Encounter (Signed)
 Natalie Mclaughlin (mom) is a current patient would like to know if her daughter can be seen as a new patient please advise if we may schedule

## 2024-01-03 NOTE — Telephone Encounter (Signed)
 Called pt and schedule appt for new pt

## 2024-01-03 NOTE — Telephone Encounter (Signed)
 Called pt and schedule appt for new pt sent to [facility] meant to send to [place] [facility]

## 2024-04-10 ENCOUNTER — Ambulatory Visit: Admitting: Primary Care

## 2024-04-10 ENCOUNTER — Ambulatory Visit: Admitting: Family

## 2024-04-15 IMAGING — US US BREAST*R* LIMITED INC AXILLA
1 series · 5 of 5 positions shown · non-contrast
Comparison: Previous exams.

CLINICAL DATA: 15-year-old presenting for short-term interval
follow-up of a mass in the UPPER INNER QUADRANT of the RIGHT breast
which was biopsied in June 2021, and was shown to most likely
represent a fibroadenoma.

EXAM:
ULTRASOUND OF THE RIGHT BREAST

[Series 1: [facility] right inc trans 20 · 5 of 5 slices shown]
[im 1/5]
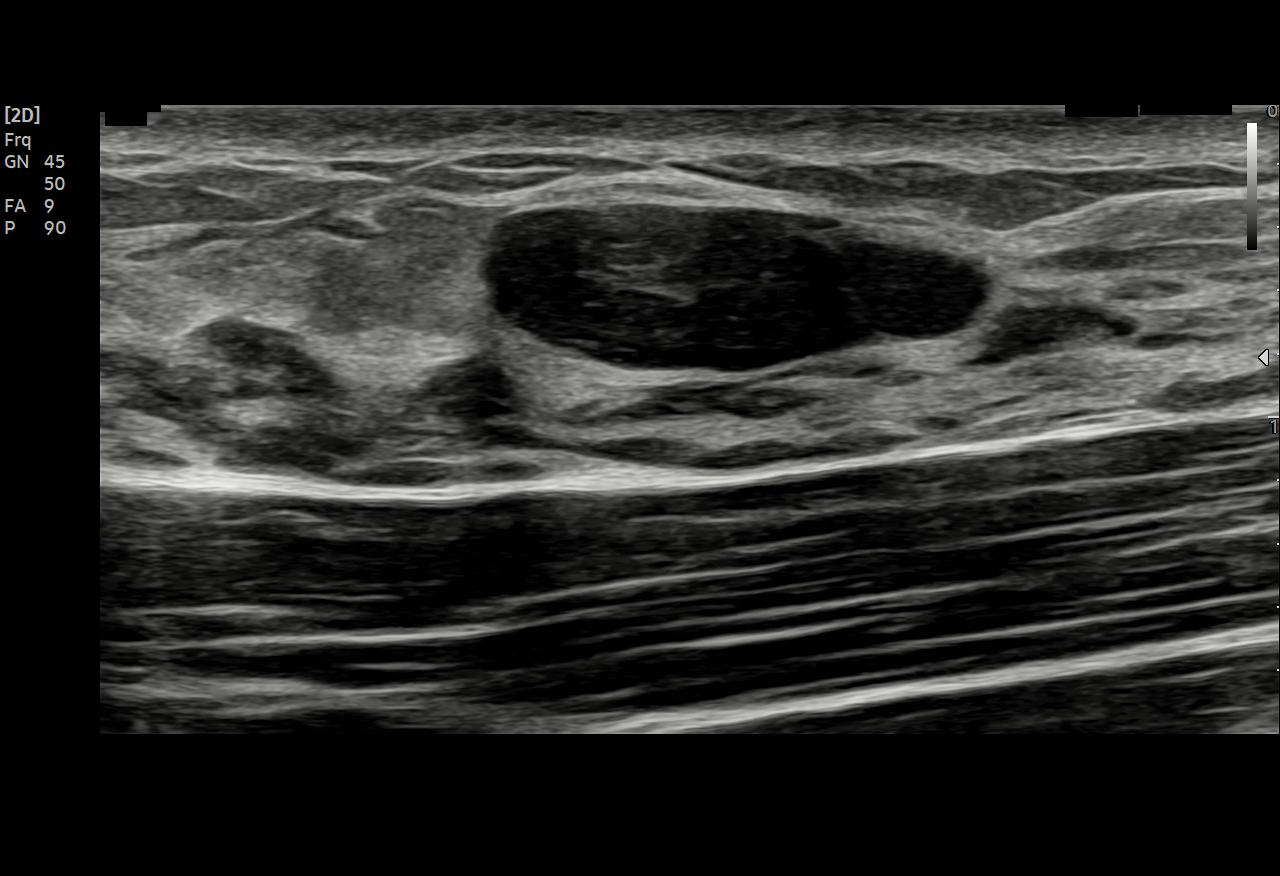
[im 2/5]
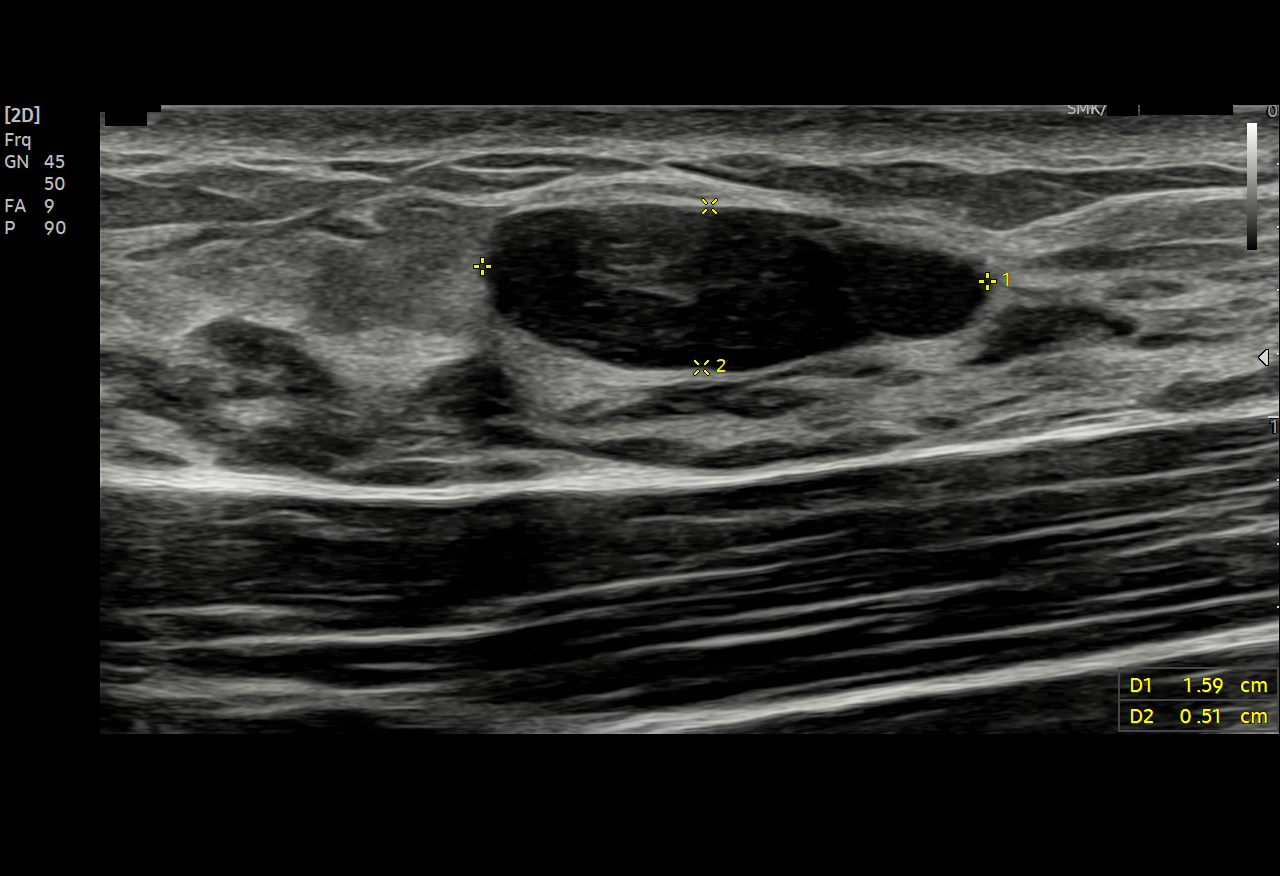
[im 3/5]
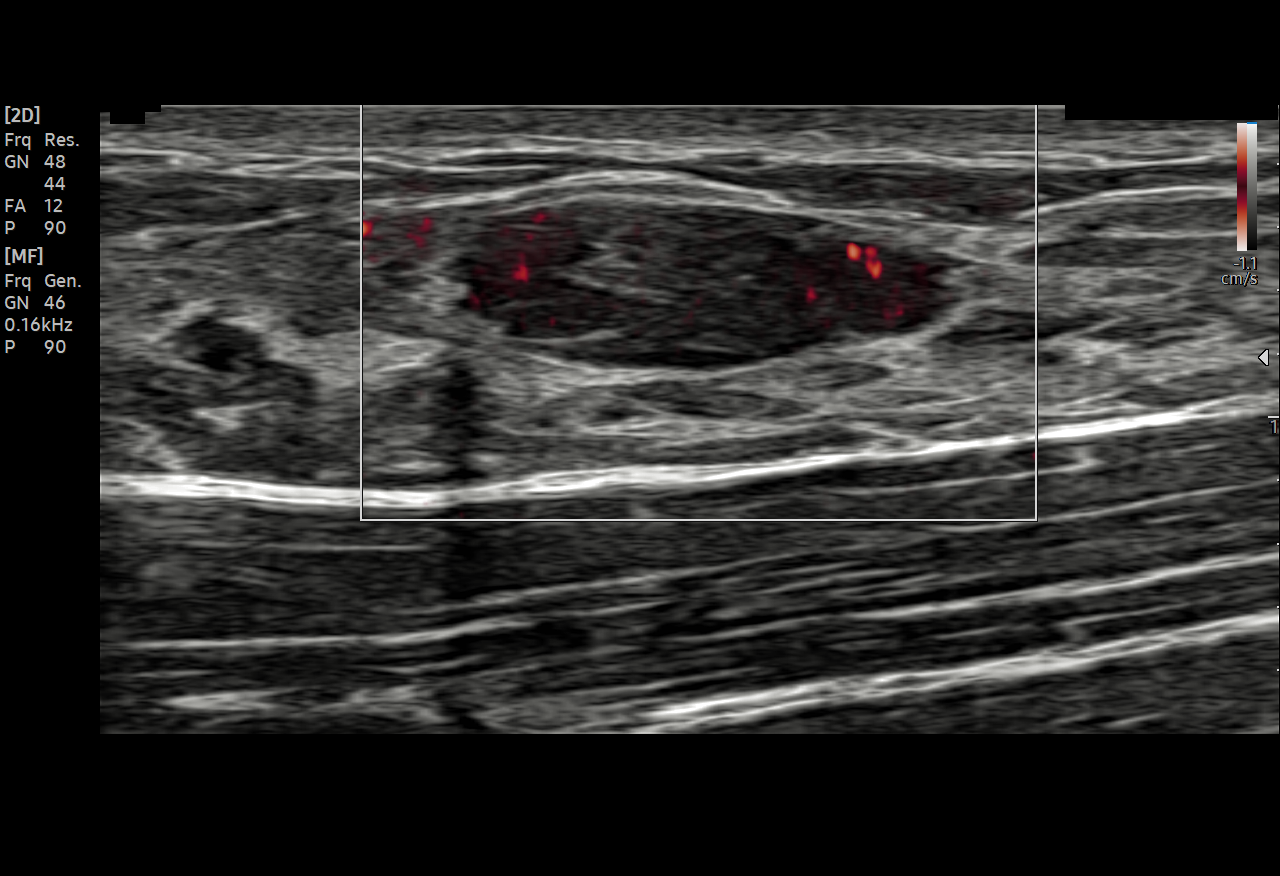
[im 4/5]
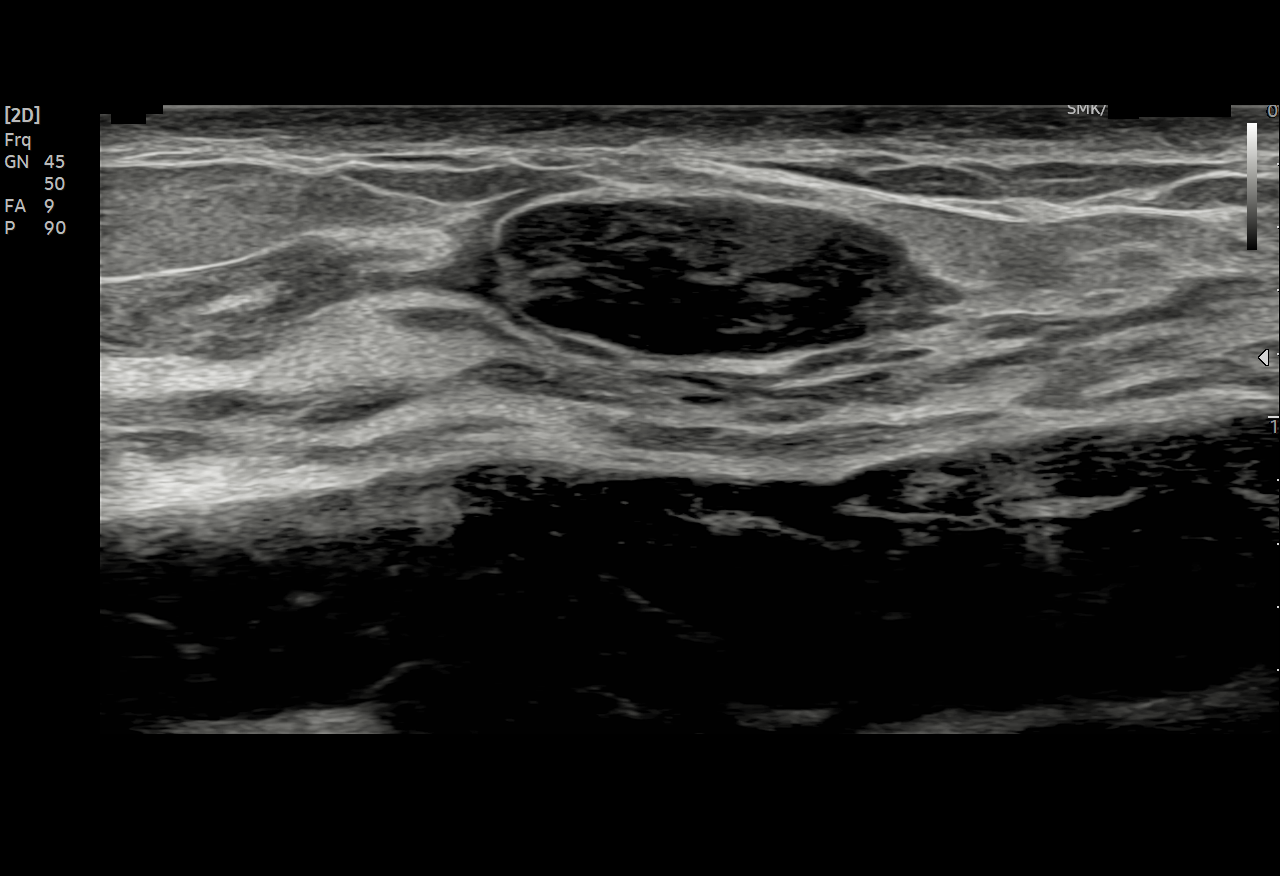
[im 5/5]
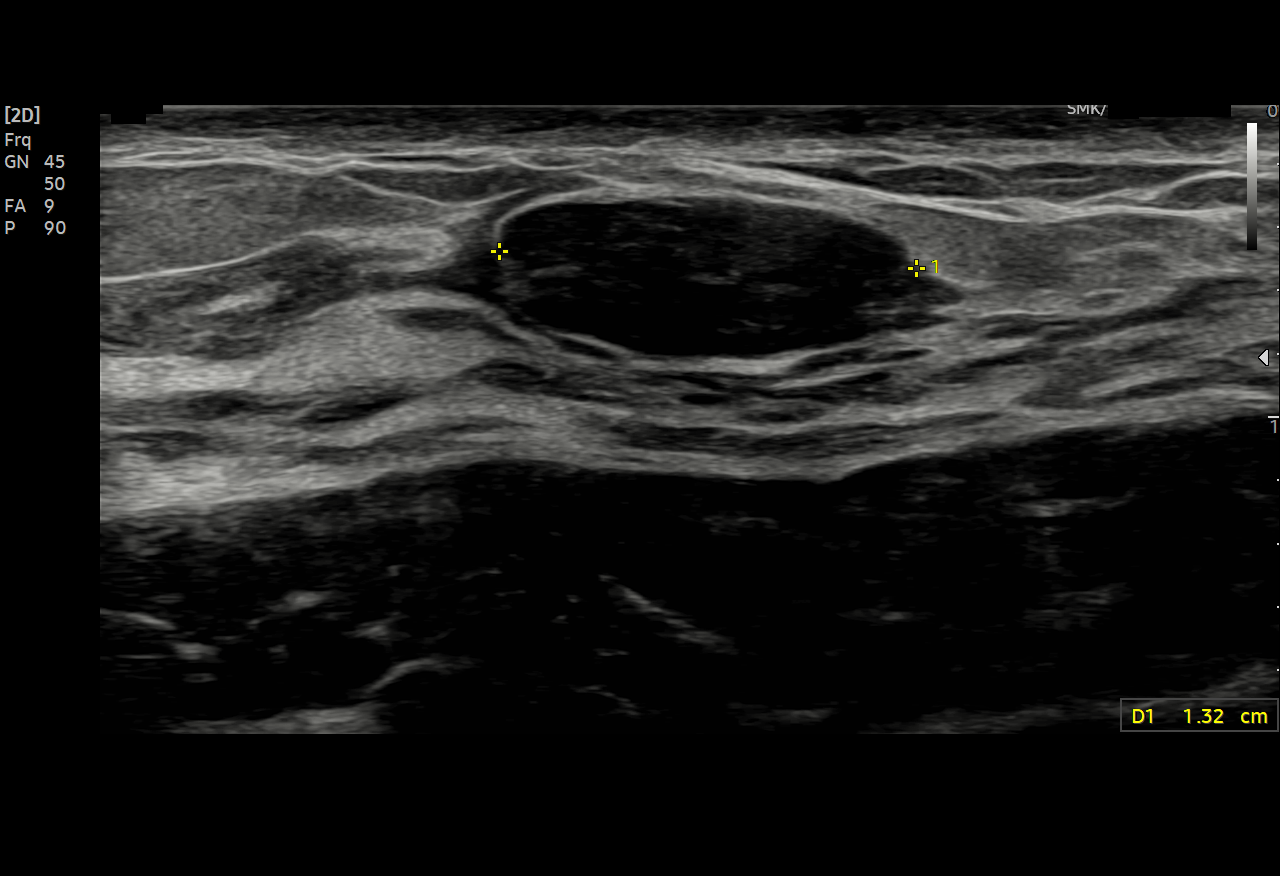

[5 of 5 positions shown; findings below may reference images not displayed]

FINDINGS: Targeted ultrasound is performed, showing that the previously
biopsied oval parallel circumscribed hypoechoic mass at 1 o'clock 3
cm from the nipple is unchanged since the time of biopsy, currently
measuring approximately 1.6 x 0.5 x 1.3 cm (previously 1.7 x 0.7 x
1.3 cm), demonstrating no posterior characteristics and
demonstrating internal power Doppler flow.
IMPRESSION: Stable 1.6 cm biopsy-proven fibroadenoma involving the UPPER INNER
QUADRANT of the RIGHT breast at 1 o'clock 3 cm from the nipple.

RECOMMENDATION:
No further imaging follow-up is felt necessary unless the mass
becomes significantly larger by palpation.

I have discussed the findings and recommendations with the patient
and her mother.

BI-RADS CATEGORY  2: Benign.
